# Patient Record
Sex: Male | Born: 1960 | Race: White | Hispanic: No | Marital: Married | Smoking: Never smoker
Health system: Southern US, Community
[De-identification: ages and names within clinical notes are randomized; demographics above are authoritative.]

## PROBLEM LIST (undated history)

## (undated) DIAGNOSIS — E7849 Other hyperlipidemia: Secondary | ICD-10-CM

## (undated) DIAGNOSIS — M47817 Spondylosis without myelopathy or radiculopathy, lumbosacral region: Secondary | ICD-10-CM

## (undated) DIAGNOSIS — M159 Polyosteoarthritis, unspecified: Secondary | ICD-10-CM

## (undated) DIAGNOSIS — F419 Anxiety disorder, unspecified: Secondary | ICD-10-CM

## (undated) DIAGNOSIS — N200 Calculus of kidney: Secondary | ICD-10-CM

## (undated) DIAGNOSIS — G2581 Restless legs syndrome: Secondary | ICD-10-CM

## (undated) DIAGNOSIS — T8859XA Other complications of anesthesia, initial encounter: Secondary | ICD-10-CM

## (undated) HISTORY — DX: Spondylosis without myelopathy or radiculopathy, lumbosacral region: M47.817

## (undated) HISTORY — PX: CARPAL TUNNEL RELEASE: SHX101

## (undated) HISTORY — PX: SHOULDER SURGERY: SHX246

## (undated) HISTORY — DX: Polyosteoarthritis, unspecified: M15.9

## (undated) HISTORY — DX: Anxiety disorder, unspecified: F41.9

## (undated) HISTORY — DX: Calculus of kidney: N20.0

## (undated) HISTORY — DX: Restless legs syndrome: G25.81

## (undated) HISTORY — DX: Other hyperlipidemia: E78.49

---

## 2002-01-03 ENCOUNTER — Emergency Department (HOSPITAL_COMMUNITY): Admission: EM | Admit: 2002-01-03 | Discharge: 2002-01-03 | Payer: Self-pay | Admitting: Emergency Medicine

## 2003-08-17 ENCOUNTER — Ambulatory Visit (HOSPITAL_COMMUNITY): Admission: RE | Admit: 2003-08-17 | Discharge: 2003-08-17 | Payer: Self-pay | Admitting: Internal Medicine

## 2003-08-17 ENCOUNTER — Encounter: Payer: Self-pay | Admitting: Internal Medicine

## 2009-05-08 ENCOUNTER — Emergency Department (HOSPITAL_COMMUNITY): Admission: EM | Admit: 2009-05-08 | Discharge: 2009-05-08 | Payer: Self-pay | Admitting: Emergency Medicine

## 2011-02-26 LAB — URINALYSIS, ROUTINE W REFLEX MICROSCOPIC
Glucose, UA: NEGATIVE mg/dL
Leukocytes, UA: NEGATIVE
Specific Gravity, Urine: >1.03 — ABNORMAL HIGH (ref 1.005–1.030)

## 2011-02-26 LAB — URINE MICROSCOPIC-ADD ON

## 2014-01-13 ENCOUNTER — Other Ambulatory Visit (HOSPITAL_COMMUNITY): Payer: Self-pay

## 2014-01-13 DIAGNOSIS — G4730 Sleep apnea, unspecified: Secondary | ICD-10-CM

## 2014-03-03 ENCOUNTER — Other Ambulatory Visit (HOSPITAL_COMMUNITY): Payer: Self-pay

## 2014-03-03 DIAGNOSIS — G4730 Sleep apnea, unspecified: Secondary | ICD-10-CM

## 2014-12-22 DIAGNOSIS — M75100 Unspecified rotator cuff tear or rupture of unspecified shoulder, not specified as traumatic: Secondary | ICD-10-CM | POA: Insufficient documentation

## 2014-12-22 DIAGNOSIS — M19012 Primary osteoarthritis, left shoulder: Secondary | ICD-10-CM | POA: Insufficient documentation

## 2014-12-22 DIAGNOSIS — M19019 Primary osteoarthritis, unspecified shoulder: Secondary | ICD-10-CM | POA: Insufficient documentation

## 2015-07-04 DIAGNOSIS — K635 Polyp of colon: Secondary | ICD-10-CM | POA: Insufficient documentation

## 2016-01-06 ENCOUNTER — Other Ambulatory Visit (HOSPITAL_COMMUNITY): Payer: Self-pay | Admitting: Internal Medicine

## 2016-01-06 DIAGNOSIS — R7989 Other specified abnormal findings of blood chemistry: Secondary | ICD-10-CM

## 2016-01-06 DIAGNOSIS — R945 Abnormal results of liver function studies: Principal | ICD-10-CM

## 2016-01-13 ENCOUNTER — Ambulatory Visit (HOSPITAL_COMMUNITY): Admission: RE | Admit: 2016-01-13 | Payer: BC Managed Care – PPO | Source: Ambulatory Visit

## 2018-05-14 ENCOUNTER — Other Ambulatory Visit: Payer: Self-pay | Admitting: Unknown Physician Specialty

## 2018-05-14 DIAGNOSIS — R430 Anosmia: Secondary | ICD-10-CM

## 2018-05-29 ENCOUNTER — Ambulatory Visit
Admission: RE | Admit: 2018-05-29 | Discharge: 2018-05-29 | Disposition: A | Discharge status: Home / Self Care | Payer: BLUE CROSS/BLUE SHIELD | Source: Ambulatory Visit | Attending: Unknown Physician Specialty | Admitting: Unknown Physician Specialty

## 2019-08-28 ENCOUNTER — Other Ambulatory Visit: Payer: Self-pay

## 2019-08-28 ENCOUNTER — Ambulatory Visit: Payer: Self-pay

## 2019-08-28 DIAGNOSIS — Z23 Encounter for immunization: Secondary | ICD-10-CM

## 2020-03-18 DIAGNOSIS — R69 Illness, unspecified: Secondary | ICD-10-CM | POA: Diagnosis not present

## 2020-03-18 DIAGNOSIS — Z79899 Other long term (current) drug therapy: Secondary | ICD-10-CM | POA: Diagnosis not present

## 2020-03-18 DIAGNOSIS — R7301 Impaired fasting glucose: Secondary | ICD-10-CM | POA: Diagnosis not present

## 2020-03-18 DIAGNOSIS — G2581 Restless legs syndrome: Secondary | ICD-10-CM | POA: Diagnosis not present

## 2020-03-18 DIAGNOSIS — Z125 Encounter for screening for malignant neoplasm of prostate: Secondary | ICD-10-CM | POA: Diagnosis not present

## 2020-03-18 DIAGNOSIS — E785 Hyperlipidemia, unspecified: Secondary | ICD-10-CM | POA: Diagnosis not present

## 2020-03-18 DIAGNOSIS — R945 Abnormal results of liver function studies: Secondary | ICD-10-CM | POA: Diagnosis not present

## 2020-03-25 DIAGNOSIS — R69 Illness, unspecified: Secondary | ICD-10-CM | POA: Diagnosis not present

## 2020-03-25 DIAGNOSIS — G2581 Restless legs syndrome: Secondary | ICD-10-CM | POA: Diagnosis not present

## 2020-03-25 DIAGNOSIS — I10 Essential (primary) hypertension: Secondary | ICD-10-CM | POA: Diagnosis not present

## 2020-03-25 DIAGNOSIS — Z0001 Encounter for general adult medical examination with abnormal findings: Secondary | ICD-10-CM | POA: Diagnosis not present

## 2020-03-25 DIAGNOSIS — R945 Abnormal results of liver function studies: Secondary | ICD-10-CM | POA: Diagnosis not present

## 2020-03-25 DIAGNOSIS — Z6838 Body mass index (BMI) 38.0-38.9, adult: Secondary | ICD-10-CM | POA: Diagnosis not present

## 2020-03-25 DIAGNOSIS — R7301 Impaired fasting glucose: Secondary | ICD-10-CM | POA: Diagnosis not present

## 2020-07-05 DIAGNOSIS — H43812 Vitreous degeneration, left eye: Secondary | ICD-10-CM | POA: Diagnosis not present

## 2020-07-19 DIAGNOSIS — D3131 Benign neoplasm of right choroid: Secondary | ICD-10-CM | POA: Diagnosis not present

## 2020-07-19 DIAGNOSIS — H4312 Vitreous hemorrhage, left eye: Secondary | ICD-10-CM | POA: Diagnosis not present

## 2020-07-19 DIAGNOSIS — H33312 Horseshoe tear of retina without detachment, left eye: Secondary | ICD-10-CM | POA: Diagnosis not present

## 2020-07-19 DIAGNOSIS — Q141 Congenital malformation of retina: Secondary | ICD-10-CM | POA: Diagnosis not present

## 2020-07-21 DIAGNOSIS — H33311 Horseshoe tear of retina without detachment, right eye: Secondary | ICD-10-CM | POA: Diagnosis not present

## 2020-07-21 DIAGNOSIS — H4312 Vitreous hemorrhage, left eye: Secondary | ICD-10-CM | POA: Diagnosis not present

## 2020-07-22 DIAGNOSIS — H4312 Vitreous hemorrhage, left eye: Secondary | ICD-10-CM | POA: Diagnosis not present

## 2020-07-22 DIAGNOSIS — H33312 Horseshoe tear of retina without detachment, left eye: Secondary | ICD-10-CM | POA: Diagnosis not present

## 2020-07-29 DIAGNOSIS — H33312 Horseshoe tear of retina without detachment, left eye: Secondary | ICD-10-CM | POA: Diagnosis not present

## 2020-08-17 DIAGNOSIS — H31092 Other chorioretinal scars, left eye: Secondary | ICD-10-CM | POA: Diagnosis not present

## 2020-10-19 DIAGNOSIS — I4819 Other persistent atrial fibrillation: Secondary | ICD-10-CM

## 2020-10-19 HISTORY — DX: Other persistent atrial fibrillation: I48.19

## 2020-11-08 ENCOUNTER — Ambulatory Visit (INDEPENDENT_AMBULATORY_CARE_PROVIDER_SITE_OTHER): Payer: 59

## 2020-11-08 ENCOUNTER — Ambulatory Visit
Admission: EM | Admit: 2020-11-08 | Discharge: 2020-11-08 | Disposition: A | Discharge status: Home / Self Care | Payer: 59 | Attending: Family Medicine | Admitting: Family Medicine

## 2020-11-08 ENCOUNTER — Encounter (HOSPITAL_COMMUNITY): Payer: Self-pay | Admitting: Emergency Medicine

## 2020-11-08 ENCOUNTER — Emergency Department (HOSPITAL_COMMUNITY)
Admission: EM | Admit: 2020-11-08 | Discharge: 2020-11-08 | Disposition: A | Discharge status: Home / Self Care | Payer: 59 | Attending: Emergency Medicine | Admitting: Emergency Medicine

## 2020-11-08 ENCOUNTER — Emergency Department (HOSPITAL_COMMUNITY): Payer: 59

## 2020-11-08 ENCOUNTER — Encounter: Payer: Self-pay | Admitting: Emergency Medicine

## 2020-11-08 ENCOUNTER — Other Ambulatory Visit: Payer: Self-pay

## 2020-11-08 DIAGNOSIS — R0602 Shortness of breath: Secondary | ICD-10-CM | POA: Diagnosis not present

## 2020-11-08 DIAGNOSIS — R000 Tachycardia, unspecified: Secondary | ICD-10-CM | POA: Diagnosis not present

## 2020-11-08 DIAGNOSIS — I4891 Unspecified atrial fibrillation: Secondary | ICD-10-CM | POA: Insufficient documentation

## 2020-11-08 DIAGNOSIS — R059 Cough, unspecified: Secondary | ICD-10-CM | POA: Diagnosis not present

## 2020-11-08 DIAGNOSIS — U071 COVID-19: Secondary | ICD-10-CM

## 2020-11-08 DIAGNOSIS — R0902 Hypoxemia: Secondary | ICD-10-CM | POA: Diagnosis not present

## 2020-11-08 DIAGNOSIS — R918 Other nonspecific abnormal finding of lung field: Secondary | ICD-10-CM | POA: Diagnosis not present

## 2020-11-08 HISTORY — DX: COVID-19: U07.1

## 2020-11-08 LAB — BASIC METABOLIC PANEL
Anion gap: 11 (ref 5–15)
BUN: 20 mg/dL (ref 6–20)
CO2: 24 mmol/L (ref 22–32)
Calcium: 8.9 mg/dL (ref 8.9–10.3)
Chloride: 104 mmol/L (ref 98–111)
Creatinine, Ser: 1.01 mg/dL (ref 0.61–1.24)
GFR, Estimated: >60 mL/min (ref >60)
Glucose, Bld: 154 mg/dL — ABNORMAL HIGH (ref 70–99)
Potassium: 3.3 mmol/L — ABNORMAL LOW (ref 3.5–5.1)
Sodium: 139 mmol/L (ref 135–145)

## 2020-11-08 LAB — CBC
HCT: 44.8 % (ref 39.0–52.0)
Hemoglobin: 15.3 g/dL (ref 13.0–17.0)
MCH: 29.8 pg (ref 26.0–34.0)
MCHC: 34.2 g/dL (ref 30.0–36.0)
MCV: 87.2 fL (ref 80.0–100.0)
Platelets: 174 10*3/uL (ref 150–400)
RBC: 5.14 MIL/uL (ref 4.22–5.81)
RDW: 12.3 % (ref 11.5–15.5)
WBC: 5.4 10*3/uL (ref 4.0–10.5)
nRBC: 0 % (ref 0.0–0.2)

## 2020-11-08 LAB — RESP PANEL BY RT-PCR (FLU A&B, COVID) ARPGX2
Influenza A by PCR: NEGATIVE
Influenza B by PCR: NEGATIVE
SARS Coronavirus 2 by RT PCR: POSITIVE — AB

## 2020-11-08 LAB — BRAIN NATRIURETIC PEPTIDE: B Natriuretic Peptide: 41 pg/mL (ref 0.0–100.0)

## 2020-11-08 LAB — TROPONIN I (HIGH SENSITIVITY): Troponin I (High Sensitivity): 5 ng/L (ref <18)

## 2020-11-08 MED ORDER — ALBUTEROL SULFATE HFA 108 (90 BASE) MCG/ACT IN AERS: 2.0000 | INHALATION_SPRAY | Freq: Once | RESPIRATORY_TRACT | Status: AC

## 2020-11-08 MED ORDER — APIXABAN 5 MG PO TABS: 5.0000 mg | ORAL_TABLET | Freq: Two times a day (BID) | ORAL | 0 refills | Status: DC

## 2020-11-08 MED ORDER — METOPROLOL TARTRATE 5 MG/5ML IV SOLN
5.0000 mg | Freq: Once | INTRAVENOUS | Status: AC
  Administered 2020-11-08: 14:00:00 5 mg via INTRAVENOUS
  Filled 2020-11-08: qty 5

## 2020-11-08 MED ORDER — METOPROLOL TARTRATE 25 MG PO TABS: 50.0000 mg | ORAL_TABLET | Freq: Two times a day (BID) | ORAL | 0 refills | Status: DC

## 2020-11-08 MED ORDER — ALBUTEROL SULFATE HFA 108 (90 BASE) MCG/ACT IN AERS
INHALATION_SPRAY | RESPIRATORY_TRACT | Status: AC
  Administered 2020-11-08: 14:00:00 2 via RESPIRATORY_TRACT
  Filled 2020-11-08: qty 6.7

## 2020-11-08 NOTE — ED Triage Notes (Signed)
Pt sent from urgent care due to tachycardia and SHOB. Pt reports SHOB and fever x 5 days.

## 2020-11-08 NOTE — ED Provider Notes (Signed)
[facility] EMERGENCY DEPARTMENT Provider Note  CSN: [id number] Arrival date & time: 11/08/20 1104    History Chief Complaint  Patient presents with  . Shortness of Breath    HPI  Michael Fuller is a 59 y.o. male with no significant PMH reports he had cough and congestion for a couple of weeks that he thought was getting better but since Friday of last week (4-5 days ago) he has had worsening SOB, DOE and chest tightness. Improved with rest, not associated with fever or leg swelling. He was not better this morning so he went to UC but was sent to the ED for HR 150s.    History reviewed. No pertinent past medical history.  Past Surgical History:  Procedure Laterality Date  . BACK SURGERY      No family history on file.  Social History   Tobacco Use  . Smoking status: Never Smoker  . Smokeless tobacco: Never Used  Substance Use Topics  . Alcohol use: Yes    Comment: occ  . Drug use: Never     Home Medications Prior to Admission medications   Medication Sig Start Date End Date Taking? Authorizing Provider  apixaban (ELIQUIS) 5 MG TABS tablet Take 1 tablet (5 mg total) by mouth 2 (two) times daily. 11/08/20 12/08/20  Truddie Hidden, MD  metoprolol tartrate (LOPRESSOR) 25 MG tablet Take 2 tablets (50 mg total) by mouth 2 (two) times daily. 11/08/20   Truddie Hidden, MD     Allergies    Patient has no known allergies.   Review of Systems   Review of Systems A comprehensive review of systems was completed and negative except as noted in HPI.    Physical Exam BP (!) 147/89   Pulse 84   Resp 20   Ht 6\' 1"  (1.854 m)   Wt 127 kg   SpO2 99%   BMI 36.94 kg/m   Physical Exam Vitals and nursing note reviewed.  Constitutional:      Appearance: Normal appearance.  HENT:     Head: Normocephalic and atraumatic.     Nose: Nose normal.     Mouth/Throat:     Mouth: Mucous membranes are moist.  Eyes:     Extraocular Movements: Extraocular movements  intact.     Conjunctiva/sclera: Conjunctivae normal.  Cardiovascular:     Rate and Rhythm: Tachycardia present. Rhythm irregular.  Pulmonary:     Effort: Pulmonary effort is normal.     Breath sounds: Normal breath sounds.  Abdominal:     General: Abdomen is flat.     Palpations: Abdomen is soft.     Tenderness: There is no abdominal tenderness.  Musculoskeletal:        General: No swelling. Normal range of motion.     Cervical back: Neck supple.  Skin:    General: Skin is warm and dry.  Neurological:     General: No focal deficit present.     Mental Status: He is alert.  Psychiatric:        Mood and Affect: Mood normal.      ED Results / Procedures / Treatments   Labs (all labs ordered are listed, but only abnormal results are displayed) Labs Reviewed  RESP PANEL BY RT-PCR (FLU A&B, COVID) ARPGX2 - Abnormal; Notable for the following components:      Result Value   SARS Coronavirus 2 by RT PCR POSITIVE (*)    All other components within normal limits  BASIC  METABOLIC PANEL - Abnormal; Notable for the following components:   Potassium 3.3 (*)    Glucose, Bld 154 (*)    All other components within normal limits  CBC  BRAIN NATRIURETIC PEPTIDE  TROPONIN I (HIGH SENSITIVITY)  TROPONIN I (HIGH SENSITIVITY)    EKG EKG Interpretation  Date/Time:  Tuesday November 08 2020 11:12:42 EST Ventricular Rate:  116 PR Interval:    QRS Duration: 84 QT Interval:  316 QTC Calculation: 439 R Axis:   58 Text Interpretation: Atrial fibrillation with rapid ventricular response No old tracing to compare Confirmed by Calvert Cantor [phone]) on 11/08/2020 11:30:57 AM   Radiology DG Chest 2 View  Result Date: 11/08/2020 CLINICAL DATA:  Shortness of breath for 5 days, low oxygen saturation, tachycardia, cough EXAM: CHEST - 2 VIEW COMPARISON:  None FINDINGS: Normal heart size, mediastinal contours, and pulmonary vascularity. Patchy BILATERAL pulmonary infiltrates consistent with  multifocal pneumonia, cannot exclude COVID-19 . No pleural effusion or pneumothorax. Osseous structures unremarkable. IMPRESSION: Patchy BILATERAL pulmonary infiltrates consistent with multifocal pneumonia, cannot exclude COVID-19 as etiology. Electronically Signed   By: Lavonia Dana M.D.   On: 11/08/2020 10:49    Procedures Procedures  Medications Ordered in the ED Medications  metoprolol tartrate (LOPRESSOR) injection 5 mg (has no administration in time range)     MDM Rules/Calculators/A&P MDM Patient arrives from UC with SOB and tachycardia. EKG shows afib, RVR, variable rate on monitor from 90-120s. BP is adequate. No distress while resting. Will check labs, CXR and Covid swab.  ED Course  I have reviewed the triage vital signs and the nursing notes.  Pertinent labs & imaging results that were available during my care of the patient were reviewed by me and considered in my medical decision making (see chart for details).  Clinical Course as of 11/08/20 1347  Tue Nov 08, 2020  1215 CBC, BMP, Trop neg.  [CS]  1238 BNP is normal.  [CS]  5465 Covid is positive. CXR from UC reviewed and shows bilateral infiltrates, he is not hypoxic but with new onset afib, will discuss with cardiology.  [CS]  6812 Spoke with Dr. Domenic Polite, Cardiology who does not feel the patient needs to be admitted for his Afib, recommends starting metoprolol and eliquis for rate control and stroke prevention. Outpatient follow up in Afib clinic when recovered from Covid. Patient also advised to obtain a home SpO2 monitor and to return to the ED if he becomes hypoxic. Referral to [facility] as well. Patient amenable to this plan. One dose of IV metoprolol prior to discharge.  [CS]    Clinical Course User Index [CS] Truddie Hidden, MD    Final Clinical Impression(s) / ED Diagnoses Final diagnoses:  Atrial fibrillation, unspecified type ([place])  COVID-19    Rx / DC Orders ED Discharge Orders         Ordered     metoprolol tartrate (LOPRESSOR) 25 MG tablet  2 times daily        11/08/20 1347    apixaban (ELIQUIS) 5 MG TABS tablet  2 times daily        11/08/20 1347           Truddie Hidden, MD 11/08/20 1347

## 2020-11-08 NOTE — ED Triage Notes (Signed)
Shortness of breath for past 4-5 days

## 2020-11-08 NOTE — ED Notes (Signed)
Date and time results received: 11/08/20 1154 (use smartphrase ".now" to insert current time)  Test: Covid Critical Value: Positive  Name of Provider Notified:Dr. Karle Starch  Orders Received? Or Actions Taken?:

## 2020-11-08 NOTE — ED Notes (Signed)
Patient is being discharged from the Urgent Care and sent to the Emergency Department via pov. Per Faustino Congress , patient is in need of higher level of care due to shortness of breath elevated HR  Patient is aware and verbalizes understanding of plan of care.  Vitals:   11/08/20 1028  BP: (!) 155/92  Pulse: 100  Resp: 19  Temp: 97.6 F (36.4 C)  SpO2: 97%

## 2020-11-10 ENCOUNTER — Telehealth (HOSPITAL_COMMUNITY): Payer: Self-pay | Admitting: Physician Assistant

## 2020-11-10 NOTE — Telephone Encounter (Signed)
Called and left message for patient to call back.  Need to schedule ED f/u appt 1st week of January d/t being Covid positive.

## 2020-11-22 DIAGNOSIS — I480 Paroxysmal atrial fibrillation: Secondary | ICD-10-CM | POA: Diagnosis not present

## 2020-11-23 ENCOUNTER — Other Ambulatory Visit (HOSPITAL_COMMUNITY): Payer: Self-pay | Admitting: Internal Medicine

## 2020-11-23 DIAGNOSIS — I4891 Unspecified atrial fibrillation: Secondary | ICD-10-CM

## 2020-11-25 ENCOUNTER — Ambulatory Visit (HOSPITAL_COMMUNITY)
Admission: RE | Admit: 2020-11-25 | Discharge: 2020-11-25 | Disposition: A | Discharge status: Home / Self Care | Payer: 59 | Source: Ambulatory Visit | Attending: Internal Medicine | Admitting: Internal Medicine

## 2020-11-25 ENCOUNTER — Other Ambulatory Visit: Payer: Self-pay

## 2020-11-25 DIAGNOSIS — I4891 Unspecified atrial fibrillation: Secondary | ICD-10-CM | POA: Diagnosis not present

## 2020-11-25 DIAGNOSIS — Z79899 Other long term (current) drug therapy: Secondary | ICD-10-CM | POA: Diagnosis not present

## 2020-11-25 HISTORY — PX: TRANSTHORACIC ECHOCARDIOGRAM: SHX275

## 2020-11-25 LAB — ECHOCARDIOGRAM COMPLETE
AR max vel: 2.38 cm2
AV Area VTI: 2.54 cm2
AV Area mean vel: 2.55 cm2
AV Mean grad: 3.6 mmHg
AV Peak grad: 7.1 mmHg
Ao pk vel: 1.33 m/s
Area-P 1/2: 4.65 cm2
S' Lateral: 3 cm

## 2020-11-25 NOTE — Progress Notes (Signed)
*  PRELIMINARY RESULTS* Echocardiogram 2D Echocardiogram has been performed.  Michael Fuller 11/25/2020, 2:42 PM

## 2020-11-29 DIAGNOSIS — I480 Paroxysmal atrial fibrillation: Secondary | ICD-10-CM | POA: Diagnosis not present

## 2020-12-27 ENCOUNTER — Encounter: Payer: Self-pay | Admitting: *Deleted

## 2020-12-27 ENCOUNTER — Encounter: Payer: Self-pay | Admitting: Cardiology

## 2020-12-27 ENCOUNTER — Ambulatory Visit (INDEPENDENT_AMBULATORY_CARE_PROVIDER_SITE_OTHER): Payer: 59 | Admitting: Cardiology

## 2020-12-27 ENCOUNTER — Telehealth: Payer: Self-pay | Admitting: *Deleted

## 2020-12-27 ENCOUNTER — Other Ambulatory Visit: Payer: Self-pay

## 2020-12-27 DIAGNOSIS — I4891 Unspecified atrial fibrillation: Secondary | ICD-10-CM | POA: Diagnosis not present

## 2020-12-27 DIAGNOSIS — I4819 Other persistent atrial fibrillation: Secondary | ICD-10-CM | POA: Insufficient documentation

## 2020-12-27 DIAGNOSIS — E7849 Other hyperlipidemia: Secondary | ICD-10-CM | POA: Insufficient documentation

## 2020-12-27 DIAGNOSIS — R0609 Other forms of dyspnea: Secondary | ICD-10-CM | POA: Insufficient documentation

## 2020-12-27 DIAGNOSIS — R0600 Dyspnea, unspecified: Secondary | ICD-10-CM | POA: Diagnosis not present

## 2020-12-27 DIAGNOSIS — I10 Essential (primary) hypertension: Secondary | ICD-10-CM | POA: Diagnosis not present

## 2020-12-27 DIAGNOSIS — R0683 Snoring: Secondary | ICD-10-CM

## 2020-12-27 MED ORDER — METOPROLOL TARTRATE 50 MG PO TABS: 50.0000 mg | ORAL_TABLET | Freq: Two times a day (BID) | ORAL | 3 refills | Status: DC

## 2020-12-27 MED ORDER — MULTAQ 400 MG PO TABS: 400.0000 mg | ORAL_TABLET | Freq: Two times a day (BID) | ORAL | 6 refills | Status: DC

## 2020-12-27 MED ORDER — RIVAROXABAN 20 MG PO TABS: 20.0000 mg | ORAL_TABLET | Freq: Every day | ORAL | 11 refills | Status: DC

## 2020-12-27 NOTE — Telephone Encounter (Signed)
Patient called to inform Dr Ellyn Hack . He received a message from Dr Ria Comment office. Eliquis is not on his formulary . Patient was changed to Xarelto 20 mg daily.  patient states he took last dose of Eliquis 5 mg  this morning .   RN  discussed with [facility] pharmacist   Per Raquel , start Xarelto 20 mg this afternoon .  Rn informed patient to take medication at 6 pm  Each day . Do not miss any days  Prior to cardioversion   patient verbalized understanding.

## 2020-12-27 NOTE — Progress Notes (Signed)
. Primary Care Provider: Asencion Noble, MD Cardiologist: Michael Hew, MD Electrophysiologist: None  Clinic Note: Chief Complaint  Patient presents with  . Atrial Fibrillation    New diagnosis in setting of COVID-19 infection  . New Patient (Initial Visit)   ===================================  ASSESSMENT/PLAN   Problem List Items Addressed This Visit    New onset atrial fibrillation (HCC) (Chronic)    He appears to be in persistent A. fib.  He is still in A. fib, but rate controlled.  Not overly symptomatic.  With relative new onset, likely CAP can potentially get a bout of A. fib may keep him out of it. Continue Eliquis until we able to make a plan about continued treatment options.  Plan:  Start Multaq 40 mg twice Fuller, and increase metoprolol to 50 mg twice Fuller  Left atrial moderately enlarged.  Pending A. fib clinic evaluation, would consider the possibility of EP consultation for possible ablation.  Reduce alcohol intake  Echocardiogram was checked, however sodium ischemic evaluation: -> Check Myoview stress test.  Schedule for DCCV (he has now been on DOAC for at least a month.)  Once he has been converted, we will check a 30-day monitor to assess A. fib burden to see if he goes back into it.  He will follow up with A. fib clinic following cardioversion.   Shared Decision Making/Informed Consent The risks [chest pain, shortness of breath, cardiac arrhythmias, dizziness, blood pressure fluctuations, myocardial infarction, stroke/transient ischemic attack, nausea, vomiting, allergic reaction, radiation exposure, metallic taste sensation and life-threatening complications (estimated to be 1 in 10,000)], benefits (risk stratification, diagnosing coronary artery disease, treatment guidance) and alternatives of a nuclear stress test were discussed in detail with Michael Fuller and he agrees to proceed.       Relevant Medications   dronedarone (MULTAQ) 400 MG tablet    metoprolol tartrate (LOPRESSOR) 50 MG tablet   Other Relevant Orders   EKG 66-AYTK   Basic metabolic panel   CBC   MYOCARDIAL PERFUSION IMAGING   Amb Referral to [facility]   Essential hypertension (Chronic)    Borderline hypotensive today.  Heart rate is 96.  Plan: Increase Lopressor to 50 mg twice Fuller.  Neck step would be to consider ARB.       Relevant Medications   dronedarone (MULTAQ) 400 MG tablet   metoprolol tartrate (LOPRESSOR) 50 MG tablet   Hyperlipidemia due to dietary fat intake (Chronic)    LDL is 182.  I suspect he is getting a statin.  We can discuss once we have the results of the stress test.      Relevant Medications   dronedarone (MULTAQ) 400 MG tablet   metoprolol tartrate (LOPRESSOR) 50 MG tablet   Morbid obesity (HCC) (Chronic)    The patient understands the need to lose weight with diet and exercise. We have discussed specific strategies for this. He put on 15 lbs in the last month.      Relevant Orders   Split night study   DOE (dyspnea on exertion) (Chronic)    I think exertional dyspnea very well be related to deconditioning, obesity but also recovering from COVID-19.  He does seem to be more short of breath when he is in A. Fib.  He didn't necessarily notice chest tightness when his heart rate going fast, but I think he was just more sick from the COVID-19. Plan: Check Lexiscan Myoview.-He indicates with his arthritis, he would not be able  to walk on a treadmill.      Relevant Orders   MYOCARDIAL PERFUSION IMAGING   Loud snoring    Epworth score is 14-15.  One risk factor is having new onset A. fib.  We'll order polysomnogram.      Relevant Orders   Split night study     ===================================  HPI:    Michael Fuller is a 60 y.o. male with a PMH below who  is being seen today for the evaluation of Michael Fuller at the request of Michael Noble, MD.  Recent  Hospitalizations:   [name] ER (11/08/2020) reported cough and congestion for couple weeks, was getting better, but then noted 4 to 5 days of worsening dyspnea shortness of breath and chest tightness.  Symptoms improved with rest.  Not associate with fever leg swelling.  He went to urgent care and was sent to ER for heart rates in the 150s (A. fib RVR).=> Was given 5 mg IV metoprolol  COVID POSITIVE=> Cardiology Consult-Dr. Domenic Polite recommended metoprolol and Eliquis and outpatient A. fib clinic follow-up, when recovered from [name].  Recommended home oxygen monitor and return to ER if hypoxic.  Michael Fuller was seen by Dr. Willey Blade on November 29, 2020 for hospital follow-up.  He was pretty much asymptomatic, but still in A. fib.  Was on Eliquis started on January 4.  Rate controlled on low-dose metoprolol.  Thyroid was 1.62.  Potassium stable.  Echo rate is relatively normal.  Still drinks about 3-6 beers per night.  Recovering from [name], just noticing fatigue.  He had been vaccinated, but not a tetanus booster.  (Recommended booster after at least 1 month.)  Reviewed  CV studies:    The following studies were reviewed today: (if available, images/films reviewed: From Epic Chart or Care Everywhere) . 2D Echo 11/25/2020: EF 65 to 70%.  Hyperdynamic LV.  Normal diastolic pressures.  Moderate LA dilation.  Otherwise normal study.  Valves, normal RV/RA pressure.  Interval History:   Michael Fuller presents here today for hospital follow-up, he really doesn't feel any irregular heartbeats or palpitations.  He says when he lies down at night he can feel his irregular pulses.  He has not had any further tachycardia.  He feels a little worn out but no significant fatigue or exertional dyspnea.  He initially had some PND orthopnea with some dyspnea but that has improved.  His rates have not gone >100 since then.  Even when he was going fast, he denied any chest pain or pressure.  Just had a  swelling and dyspnea.  He was discharged on Eliquis and metoprolol, but no diuretic.  CV Review of Symptoms (Summary) Cardiovascular ROS: no chest pain or dyspnea on exertion positive for - irregular heartbeat, palpitations and Symptoms of PND and orthopnea as well as edema resolved back in December. negative for - chest pain, edema, orthopnea, paroxysmal nocturnal dyspnea, shortness of breath or May be just a little fatigue.  No syncope or near syncope, TIAs or amaurosis fugax.  The patient does not have symptoms concerning for COVID-19 infection (fever, chills, cough, or new shortness of breath).   REVIEWED OF SYSTEMS   Review of Systems  Constitutional: Positive for malaise/fatigue (No further malaise now-he is recovering from his Covid.). Negative for weight loss (He says he actually gained about 15 lbs in the last 5 to 6 days.).  HENT: Negative for congestion and nosebleeds.   Respiratory: Positive for shortness of breath (  During the initial few days when he was sick with Covid).   Cardiovascular: Positive for palpitations (He did feel a little bit of fluttering sensation, not tachycardia back in December.). Negative for chest pain, leg swelling and PND.  Gastrointestinal: Negative for abdominal pain, blood in stool and melena.  Genitourinary: Negative for hematuria.  Musculoskeletal: Positive for back pain and joint pain (Diffuse). Negative for myalgias.  Neurological: Positive for dizziness (Early on, but not now.) and weakness. Negative for focal weakness and loss of consciousness.  Psychiatric/Behavioral: Negative for depression and memory loss. The patient is nervous/anxious and has insomnia.        See Epworth score     EPWORTH SCORE Office Visit from 12/27/2020 in [facility]   12/27/2020   2113   How likely are you to doze off or fall asleep in the following situations?   Sitting and reading? Moderate chance of dozing  Watching TV? High chance of dozing   Sitting, inactive in a public place (e.g. a theatre or a meeting)? Slight chance of dozing  As a passenger in a car for an hour without a break? Moderate chance of dozing  Lying down to rest in the afternoon when circumstances permit? High chance of dozing  Sitting and talking to someone? Would never doze  Sitting quietly after a lunch without alcohol? High chance of dozing  In a car, while stopped for a few minutes in traffic?  Would never doze  Epworth Total Score 15  Document the patient's comorbid illnesses and symptoms of sleep apnea   The patient has the following comorbid illnesses: Cardiac Arrhythmia  The patient has the following symptoms of sleep apnea: Daytime Sleepiness; Snoring; Morning Headaches; Un-refreshing Sleep; Witnessed Apnea    I have reviewed and (if needed) personally updated the patient's problem list, medications, allergies, past medical and surgical history, social and family history.   PAST MEDICAL HISTORY   Past Medical History:  Diagnosis Date  . Anxiety disorder   . COVID-19 virus infection 11/08/2020   Cough and congestion for several weeks with 4 to 5 days worsening.  Marland Kitchen DJD (degenerative joint disease), lumbosacral   . Hyperlipidemia due to dietary fat intake   . Nephrolithiasis   . Osteoarthritis involving multiple joints on both sides of body   . Persistent atrial fibrillation ([place]) 10/2020   Initial diagnosis with the setting of COVID-19 infection  . RLS (restless legs syndrome)     PAST SURGICAL HISTORY   Past Surgical History:  Procedure Laterality Date  . BACK SURGERY  1996   Lumbosacral spine--two surgeries  . CARPAL TUNNEL RELEASE Bilateral   . SHOULDER SURGERY Bilateral     Immunization History  Administered Date(s) Administered  . Influenza,inj,Quad PF,6+ Mos 08/28/2019    MEDICATIONS/ALLERGIES   Current Meds  Medication Sig  . dronedarone (MULTAQ) 400 MG tablet Take 1 tablet (400 mg total) by mouth 2 (two) times Fuller  with a meal.  . metoprolol tartrate (LOPRESSOR) 50 MG tablet Take 1 tablet (50 mg total) by mouth 2 (two) times Fuller.  Marland Kitchen venlafaxine (EFFEXOR) 75 MG tablet Take 75 mg by mouth 2 (two) times Fuller.  . [DISCONTINUED] apixaban (ELIQUIS) 5 MG TABS tablet Take 1 tablet (5 mg total) by mouth 2 (two) times Fuller.  . [DISCONTINUED] metoprolol tartrate (LOPRESSOR) 25 MG tablet Take 2 tablets (50 mg total) by mouth 2 (two) times Fuller.  Metoprolol and Eliquis started in December 2021  No Known Allergies  SOCIAL HISTORY/FAMILY  HISTORY   Reviewed in Epic:  Pertinent findings:  Social History   Tobacco Use  . Smoking status: Never Smoker  . Smokeless tobacco: Never Used  Substance Use Topics  . Alcohol use: Yes    Comment: occ  . Drug use: Never   Social History   Social History Narrative   Married father of two sons-28 and 58.  He lives with his wife in [place].   He drinks maybe 4-5 beers a day for "pain medication"    His job as a working in shift, often working night shift.   Does not regularly exercise per se. ->  His work involves doing jobs around farm.  Unfortunately hasn't been all that active recently since his COVID-19 infection, and has been scared to be very aggressive with being on Eliquis.    OBJCTIVE -PE, EKG, labs   Wt Readings from Last 3 Encounters:  12/27/20 (!) 311 lb (141.1 kg)  11/08/20 279 lb 15.8 oz (127 kg)  11/08/20 280 lb (127 kg)    Physical Exam: BP 138/90   Pulse 96   Ht 6\' 2"  (1.88 m)   Wt (!) 311 lb (141.1 kg)   BMI 39.93 kg/m  Physical Exam Constitutional:      General: He is not in acute distress.    Appearance: Normal appearance. He is obese. He is not ill-appearing or toxic-appearing.     Comments: With risk factors, BMI of 39 =morbid obesity.  HENT:     Head: Normocephalic and atraumatic.  Neck:     Vascular: No carotid bruit, hepatojugular reflux or JVD.  Cardiovascular:     Rate and Rhythm: Normal rate. Rhythm irregularly  irregular.     Pulses: Normal pulses.     Heart sounds: S2 normal. Heart sounds are distant. No murmur heard. No friction rub. No gallop.   Pulmonary:     Effort: Pulmonary effort is normal. No respiratory distress.     Breath sounds: Normal breath sounds.  Chest:     Chest wall: No tenderness.  Abdominal:     General: Bowel sounds are normal. There is no distension.     Palpations: Abdomen is soft. There is no mass.     Comments: No HSM or bruit; truncal obesity  Musculoskeletal:        General: No swelling. Normal range of motion.     Cervical back: Normal range of motion and neck supple.  Neurological:     General: No focal deficit present.     Mental Status: He is alert and oriented to person, place, and time.     Cranial Nerves: No cranial nerve deficit.     Motor: No weakness.     Gait: Gait normal.  Psychiatric:        Mood and Affect: Mood normal.        Behavior: Behavior normal.        Thought Content: Thought content normal.        Judgment: Judgment normal.     Adult ECG Report  Rate: 96 ;  Rhythm: atrial fibrillation;   Narrative Interpretation: Otherwise relatively normal.  Recent Labs: March 18, 2020: TC 11/20/2016, TG 89, HDL 44, LDL 182. No results found for: CHOL, HDL, LDLCALC, LDLDIRECT, TRIG, CHOLHDL Lab Results  Component Value Date   CREATININE 1.01 11/08/2020   BUN 20 11/08/2020   NA 139 11/08/2020   K 3.3 (L) 11/08/2020   CL 104 11/08/2020   CO2 24 11/08/2020  CBC Latest Ref Rng & Units 11/08/2020  WBC 4.0 - 10.5 K/uL 5.4  Hemoglobin 13.0 - 17.0 g/dL 15.3  Hematocrit 39.0 - 52.0 % 44.8  Platelets 150 - 400 K/uL 174    No results found for: TSH  ==================================================  COVID-19 Education: The signs and symptoms of COVID-19 were discussed with the patient and how to seek care for testing (follow up with PCP or arrange E-visit).   The importance of social distancing and COVID-19 vaccination was discussed  today. The patient is practicing social distancing & Masking.   I spent a total of 52 minutes with the patient spent in direct patient consultation.  -> Detailed description of atrial fibrillation and the pathophysiology as well as treatment options. Additional time spent with chart review  / charting (studies, outside notes, etc): 25 min; extensive chart review with medical history and surgical history updated. Total Time: 77 min   Current medicines are reviewed at length with the patient today.  (+/- concerns) N/A  This visit occurred during the SARS-CoV-2 public health emergency.  Safety protocols were in place, including screening questions prior to the visit, additional usage of staff PPE, and extensive cleaning of exam room while observing appropriate contact time as indicated for disinfecting solutions.  Notice: This dictation was prepared with Dragon dictation along with smaller phrase technology. Any transcriptional errors that result from this process are unintentional and may not be corrected upon review.  Patient Instructions / Medication Changes & Studies & Tests Ordered   Patient Instructions  Medication Instructions:   start  Taking  Multaq 400 mg one tablet twice a day   Increase Metoprolol tartrate to 50 mg one tablet twice a day   *If you need a refill on your cardiac medications before your next appointment, please call your pharmacy*   Lab Work: Cbc  bmp You will need a COVID-19  test prior to your procedure. You are scheduled for  Feb 21 ,2022  at PM. This is a Drive Up Visit at [address] [place], [place] [address]. Someone will direct you to the appropriate testing line. Stay in your car and someone will be with you shortly.   If you have labs (blood work) drawn today and your tests are completely normal, you will receive your results only by: Marland Kitchen MyChart Message (if you have MyChart) OR . A paper copy in the mail If you have any lab test that is abnormal  or we need to change your treatment, we will call you to review the results.   Testing/Procedures: WILL BE SCHEDULE ON Jan 12, 2021 3 DAYS PRIOR TO CARDIOVERSION  - NEED COVID -TEST Your physician has recommended that you have a Cardioversion (DCCV). Electrical Cardioversion uses a jolt of electricity to your heart either through paddles or wired patches attached to your chest. This is a controlled, usually prescheduled, procedure. Defibrillation is done under light anesthesia in the hospital, and you usually go home the day of the procedure. This is done to get your heart back into a normal rhythm. You are not awake for the procedure. Please see the instruction sheet given to you today.    MONITOR WILL BE MAILED TO YOU Your physician has recommended that you wear an  30 DAY event monitor  WILL BE PLACED AFTER CARDIOVERSION . Event monitors are medical devices that record the heart's electrical activity. Doctors most often [place] these monitors to diagnose arrhythmias. Arrhythmias are problems with the speed or rhythm of the  heartbeat. The monitor is a small, portable device. You can wear one while you do your normal Fuller activities. This is usually used to diagnose what is causing palpitations/syncope (passing out).   WILL BE SCHEDULE AFTER INSURANCE AUTHORIZATION Your physician has recommended that you have a sleep study. This test records several body functions during sleep, including: brain activity, eye movement, oxygen and carbon dioxide blood levels, heart rate and rhythm, breathing rate and rhythm, the flow of air through your mouth and nose, snoring, body muscle movements, and chest and belly movement.   [facility] has requested that you have a lexiscan myoview. Please follow instruction sheet, as given.     Follow-Up: At [facility], you and your health needs are our priority.  As part of our continuing mission to provide you with exceptional heart  care, we have created designated Provider Care Teams.  These Care Teams include your primary Cardiologist (physician) and Advanced Practice Providers (APPs -  Physician Assistants and Nurse Practitioners) who all work together to provide you with the care you need, when you need it.  We recommend signing up for the patient portal called "MyChart".  Sign up information is provided on this After Visit Summary.  MyChart is used to connect with patients for Virtual Visits (Telemedicine).  Patients are able to view lab/test results, encounter notes, upcoming appointments, etc.  Non-urgent messages can be sent to your provider as well.   To learn more about what you can do with MyChart, go to [email or web address].    Your next appointment:   3 month(s)  The format for your next appointment:   In Person  Provider:   Glenetta Hew, MD   Other Instructions Your physician recommends that you schedule a follow-up appointment in:  2 MONTHS WITH AFIB CLINIC - [place].   Cut down on alcohol consumption   [employer] Cardiac Event Monitor Instructions Your physician has requested you wear your cardiac event monitor for _30____ days, (1-30). [employer] may call or text to confirm a shipping address. The monitor will be sent to a land address via UPS. [employer] will not ship a monitor to a PO BOX. It typically takes 3-5 days to receive your monitor after it has been enrolled. [employer] will assist with USPS tracking if your package is delayed. The telephone number for [employer] is [phone]. Once you have received your monitor, please review the enclosed instructions. Instruction tutorials can also be viewed under help and settings on the enclosed cell phone. Your monitor has already been registered assigning a specific monitor serial # to you.  Applying the monitor Remove cell phone from case and turn it on. The  cell phone works as Dealer and needs to be within [employer] of you at all times. The cell phone will need to be charged on a Fuller basis. We recommend you plug the cell phone into the enclosed charger at your bedside table every night.  Monitor batteries: You will receive two monitor batteries labelled #1 and #2. These are your recorders. Plug battery #2 onto the second connection on the enclosed charger. Keep one battery on the charger at all times. This will keep the monitor battery deactivated. It will also keep it fully charged for when you need to switch your monitor batteries. A small light will be blinking on the battery emblem when it is charging. The  light on the battery emblem will remain on when the battery is fully charged.  Open package of a Monitor strip. Insert battery #1 into black hood on strip and gently squeeze monitor battery onto connection as indicated in instruction booklet. Set aside while preparing skin.  Choose location for your strip, vertical or horizontal, as indicated in the instruction booklet. Shave to remove all hair from location. There cannot be any lotions, oils, powders, or colognes on skin where monitor is to be applied. Wipe skin clean with enclosed Saline wipe. Dry skin completely.  Peel paper labeled #1 off the back of the Monitor strip exposing the adhesive. Place the monitor on the chest in the vertical or horizontal position shown in the instruction booklet. One arrow on the monitor strip must be pointing upward. Carefully remove paper labeled #2, attaching remainder of strip to your skin. Try not to create any folds or wrinkles in the strip as you apply it.  Firmly press and release the circle in the center of the monitor battery. You will hear a small beep. This is turning the monitor battery on. The heart emblem on the monitor battery will light up every 5 seconds if the monitor battery in turned on and connected to the patient securely.  Do not push and hold the circle down as this turns the monitor battery off. The cell phone will locate the monitor battery. A screen will appear on the cell phone checking the connection of your monitor strip. This may read poor connection initially but change to good connection within the next minute. Once your monitor accepts the connection you will hear a series of 3 beeps followed by a climbing crescendo of beeps. A screen will appear on the cell phone showing the two monitor strip placement options. Touch the picture that demonstrates where you applied the monitor strip.  Your monitor strip and battery are waterproof. You are able to shower, bathe, or swim with the monitor on. They just ask you do not submerge deeper than 3 feet underwater. We recommend removing the monitor if you are swimming in a lake, river, or ocean.  Your monitor battery will need to be switched to a fully charged monitor battery approximately once a week. The cell phone will alert you of an action which needs to be made.  On the cell phone, tap for details to reveal connection status, monitor battery status, and cell phone battery status. The green dots indicates your monitor is in good status. A red dot indicates there is something that needs your attention.  To record a symptom, click the circle on the monitor battery. In 30-60 seconds a list of symptoms will appear on the cell phone. Select your symptom and tap save. Your monitor will record a sustained or significant arrhythmia regardless of you clicking the button. Some patients do not feel the heart rhythm irregularities. [employer] will notify us of any serious or critical events.  Refer to instruction booklet for instructions on switching batteries, changing strips, the Do not disturb or Pause features, or any additional questions.  Call [employer] at [phone], to confirm your monitor is transmitting and record your baseline. They will answer any  questions you may have regarding the monitor instructions at that time.  Returning the monitor to [place] all equipment back into blue box. Peel off strip of paper to expose adhesive and close box securely. There is a prepaid UPS shipping label on this box. Drop in a UPS drop box, or  at a [place]. You may also contact [employer] to arrange UPS to pick up monitor package at your home.      Dear  Clarisa Schools are scheduled for a Cardioversion on Feb 24,2022 with Dr. Meda Coffee .  Please arrive at the [facility] (Main Entrance A) at [facility]: [address] [place], [place] [address] at  9 am.  DIET: Nothing to eat or drink after midnight except a sip of water with medications (see medication instructions below)  Medication Instructions:  Continue your anticoagulant: Eliquis  You will need to continue your anticoagulant after your procedure until you are told by your  Provider that it is safe to stop   Labs:  For patients receiving anesthesia for TEE and all Cardioversion patients: BMET, CBC within 1 week can be done in [place] at [facility]   and You will need a COVID-19  test prior to your procedure. You are scheduled for Jan 09, 2021 at  1:40  PM. This is a Drive Up Visit at [address] [place], [place] [address]. Someone will direct you to the appropriate testing line. Stay in your car and someone will be with you shortly.    You must have a responsible person to drive you home and stay in the waiting area during your procedure. Failure to do so could result in cancellation.  Bring your insurance cards.  *Special Note: Every effort is made to have your procedure done on time. Occasionally there are emergencies that occur at the hospital that may cause delays. Please be patient if a delay does occur.         Studies Ordered:   Orders Placed This Encounter  Procedures  . Basic metabolic panel  . CBC  . Amb Referral to AFIB Clinic   . MYOCARDIAL PERFUSION IMAGING  . CARDIAC EVENT MONITOR  . EKG 12-Lead  . Split night study     Michael Fuller, M.D., M.S. Interventional Cardiologist   Pager # [phone] Phone # [phone] [address] [place],  [address]   Thank you for choosing Heartcare at [facility]!!

## 2020-12-27 NOTE — Assessment & Plan Note (Addendum)
He appears to be in persistent A. fib.  He is still in A. fib, but rate controlled.  Not overly symptomatic.  With relative new onset, likely CAP can potentially get a bout of A. fib may keep him out of it. Continue Eliquis until we able to make a plan about continued treatment options.  Plan:  Start Multaq 40 mg twice daily, and increase metoprolol to 50 mg twice daily  Left atrial moderately enlarged.  Pending A. fib clinic evaluation, would consider the possibility of EP consultation for possible ablation.  Reduce alcohol intake  Echocardiogram was checked, however sodium ischemic evaluation: -> Check Myoview stress test.  Schedule for DCCV (he has now been on DOAC for at least a month.)  Once he has been converted, we will check a 30-day monitor to assess A. fib burden to see if he goes back into it.  He will follow up with A. fib clinic following cardioversion.   Shared Decision Making/Informed Consent The risks [chest pain, shortness of breath, cardiac arrhythmias, dizziness, blood pressure fluctuations, myocardial infarction, stroke/transient ischemic attack, nausea, vomiting, allergic reaction, radiation exposure, metallic taste sensation and life-threatening complications (estimated to be 1 in 10,000)], benefits (risk stratification, diagnosing coronary artery disease, treatment guidance) and alternatives of a nuclear stress test were discussed in detail with Mr. Genest and he agrees to proceed.

## 2020-12-27 NOTE — Assessment & Plan Note (Addendum)
I think exertional dyspnea very well be related to deconditioning, obesity but also recovering from COVID-19.  He does seem to be more short of breath when he is in A. Fib.  He didn't necessarily notice chest tightness when his heart rate going fast, but I think he was just more sick from the COVID-19. Plan: Check Lexiscan Myoview.-He indicates with his arthritis, he would not be able to walk on a treadmill.

## 2020-12-27 NOTE — Patient Instructions (Signed)
Medication Instructions:   start  Taking  Multaq 400 mg one tablet twice a day   Increase Metoprolol tartrate to 50 mg one tablet twice a day   *If you need a refill on your cardiac medications before your next appointment, please call your pharmacy*   Lab Work: Cbc  bmp You will need a COVID-19  test prior to your procedure. You are scheduled for  Feb 21 ,2022  at PM. This is a Drive Up Visit at [address] [place], [place] [address]. Someone will direct you to the appropriate testing line. Stay in your car and someone will be with you shortly.   If you have labs (blood work) drawn today and your tests are completely normal, you will receive your results only by: Marland Kitchen MyChart Message (if you have MyChart) OR . A paper copy in the mail If you have any lab test that is abnormal or we need to change your treatment, we will call you to review the results.   Testing/Procedures: WILL BE SCHEDULE ON Jan 12, 2021 3 DAYS PRIOR TO CARDIOVERSION  - NEED COVID -TEST Your physician has recommended that you have a Cardioversion (DCCV). Electrical Cardioversion uses a jolt of electricity to your heart either through paddles or wired patches attached to your chest. This is a controlled, usually prescheduled, procedure. Defibrillation is done under light anesthesia in the hospital, and you usually go home the day of the procedure. This is done to get your heart back into a normal rhythm. You are not awake for the procedure. Please see the instruction sheet given to you today.    MONITOR WILL BE MAILED TO YOU Your physician has recommended that you wear an  30 DAY event monitor  WILL BE PLACED AFTER CARDIOVERSION . Event monitors are medical devices that record the heart's electrical activity. Doctors most often [place] these monitors to diagnose arrhythmias. Arrhythmias are problems with the speed or rhythm of the heartbeat. The monitor is a small, portable device. You can wear one while you do your normal  daily activities. This is usually used to diagnose what is causing palpitations/syncope (passing out).   WILL BE SCHEDULE AFTER INSURANCE AUTHORIZATION Your physician has recommended that you have a sleep study. This test records several body functions during sleep, including: brain activity, eye movement, oxygen and carbon dioxide blood levels, heart rate and rhythm, breathing rate and rhythm, the flow of air through your mouth and nose, snoring, body muscle movements, and chest and belly movement.   [facility] has requested that you have a lexiscan myoview. Please follow instruction sheet, as given.     Follow-Up: At [facility], you and your health needs are our priority.  As part of our continuing mission to provide you with exceptional heart care, we have created designated Provider Care Teams.  These Care Teams include your primary Cardiologist (physician) and Advanced Practice Providers (APPs -  Physician Assistants and Nurse Practitioners) who all work together to provide you with the care you need, when you need it.  We recommend signing up for the patient portal called "MyChart".  Sign up information is provided on this After Visit Summary.  MyChart is used to connect with patients for Virtual Visits (Telemedicine).  Patients are able to view lab/test results, encounter notes, upcoming appointments, etc.  Non-urgent messages can be sent to your provider as well.   To learn more about what you can do with MyChart, go to  [email or web address].    Your next appointment:   3 month(s)  The format for your next appointment:   In Person  Provider:   Glenetta Hew, MD   Other Instructions Your physician recommends that you schedule a follow-up appointment in:  2 MONTHS WITH AFIB CLINIC - [place].   Cut down on alcohol consumption   [employer] Cardiac Event Monitor  Instructions Your physician has requested you wear your cardiac event monitor for _30____ days, (1-30). [employer] may call or text to confirm a shipping address. The monitor will be sent to a land address via UPS. [employer] will not ship a monitor to a PO BOX. It typically takes 3-5 days to receive your monitor after it has been enrolled. [employer] will assist with USPS tracking if your package is delayed. The telephone number for [employer] is [phone]. Once you have received your monitor, please review the enclosed instructions. Instruction tutorials can also be viewed under help and settings on the enclosed cell phone. Your monitor has already been registered assigning a specific monitor serial # to you.  Applying the monitor Remove cell phone from case and turn it on. The cell phone works as Dealer and needs to be within [employer] of you at all times. The cell phone will need to be charged on a daily basis. We recommend you plug the cell phone into the enclosed charger at your bedside table every night.  Monitor batteries: You will receive two monitor batteries labelled #1 and #2. These are your recorders. Plug battery #2 onto the second connection on the enclosed charger. Keep one battery on the charger at all times. This will keep the monitor battery deactivated. It will also keep it fully charged for when you need to switch your monitor batteries. A small light will be blinking on the battery emblem when it is charging. The light on the battery emblem will remain on when the battery is fully charged.  Open package of a Monitor strip. Insert battery #1 into black hood on strip and gently squeeze monitor battery onto connection as indicated in instruction booklet. Set aside while preparing skin.  Choose location for your strip, vertical or horizontal, as indicated in the instruction booklet. Shave to remove all hair from location. There cannot be any lotions, oils,  powders, or colognes on skin where monitor is to be applied. Wipe skin clean with enclosed Saline wipe. Dry skin completely.  Peel paper labeled #1 off the back of the Monitor strip exposing the adhesive. Place the monitor on the chest in the vertical or horizontal position shown in the instruction booklet. One arrow on the monitor strip must be pointing upward. Carefully remove paper labeled #2, attaching remainder of strip to your skin. Try not to create any folds or wrinkles in the strip as you apply it.  Firmly press and release the circle in the center of the monitor battery. You will hear a small beep. This is turning the monitor battery on. The heart emblem on the monitor battery will light up every 5 seconds if the monitor battery in turned on and connected to the patient securely. Do not push and hold the circle down as this turns the monitor battery off. The cell phone will locate the monitor battery. A screen will appear on the cell phone checking the connection of your monitor strip. This may read poor connection initially but change  to good connection within the next minute. Once your monitor accepts the connection you will hear a series of 3 beeps followed by a climbing crescendo of beeps. A screen will appear on the cell phone showing the two monitor strip placement options. Touch the picture that demonstrates where you applied the monitor strip.  Your monitor strip and battery are waterproof. You are able to shower, bathe, or swim with the monitor on. They just ask you do not submerge deeper than 3 feet underwater. We recommend removing the monitor if you are swimming in a lake, river, or ocean.  Your monitor battery will need to be switched to a fully charged monitor battery approximately once a week. The cell phone will alert you of an action which needs to be made.  On the cell phone, tap for details to reveal connection status, monitor battery status, and cell phone  battery status. The green dots indicates your monitor is in good status. A red dot indicates there is something that needs your attention.  To record a symptom, click the circle on the monitor battery. In 30-60 seconds a list of symptoms will appear on the cell phone. Select your symptom and tap save. Your monitor will record a sustained or significant arrhythmia regardless of you clicking the button. Some patients do not feel the heart rhythm irregularities. [employer] will notify us of any serious or critical events.  Refer to instruction booklet for instructions on switching batteries, changing strips, the Do not disturb or Pause features, or any additional questions.  Call [employer] at [phone], to confirm your monitor is transmitting and record your baseline. They will answer any questions you may have regarding the monitor instructions at that time.  Returning the monitor to [place] all equipment back into blue box. Peel off strip of paper to expose adhesive and close box securely. There is a prepaid UPS shipping label on this box. Drop in a UPS drop box, or at a UPS facility like [employer]. You may also contact [employer] to arrange UPS to pick up monitor package at your home.      Dear  Clarisa Schools are scheduled for a Cardioversion on Feb 24,2022 with Dr. Meda Coffee .  Please arrive at the [facility] (Main Entrance A) at [facility]: [address] [place], [place] [address] at  9 am.  DIET: Nothing to eat or drink after midnight except a sip of water with medications (see medication instructions below)  Medication Instructions:  Continue your anticoagulant: Eliquis  You will need to continue your anticoagulant after your procedure until you are told by your  Provider that it is safe to stop   Labs:  For patients receiving anesthesia for TEE and all Cardioversion patients: BMET, CBC within 1 week can be done in [place] at [facility]   and You will  need a COVID-19  test prior to your procedure. You are scheduled for Jan 09, 2021 at  1:40  PM. This is a Drive Up Visit at [address] [place], [place] [address]. Someone will direct you to the appropriate testing line. Stay in your car and someone will be with you shortly.    You must have a responsible person to drive you home and stay in the waiting area during your procedure. Failure to do so could result in cancellation.  Bring your insurance cards.  *Special Note: Every effort is made to have your procedure done on time. Occasionally there are emergencies that occur  at the hospital that may cause delays. Please be patient if a delay does occur.

## 2020-12-27 NOTE — Progress Notes (Signed)
Patient ID: Michael Fuller, male   DOB: January 14, 1961, 59 y.o.   MRN: [id number] Patient enrolled for [employer] to ship a 30 day cardiac event monitor to his home for 3/71/6967 application.

## 2020-12-27 NOTE — Assessment & Plan Note (Signed)
The patient understands the need to lose weight with diet and exercise. We have discussed specific strategies for this. He put on 15 lbs in the last month.

## 2020-12-27 NOTE — Assessment & Plan Note (Signed)
Borderline hypotensive today.  Heart rate is 96.  Plan: Increase Lopressor to 50 mg twice daily.  Neck step would be to consider ARB.

## 2020-12-27 NOTE — H&P (View-Only) (Signed)
. Primary Care Provider: Asencion Noble, MD Cardiologist: Glenetta Hew, MD Electrophysiologist: None  Clinic Note: Chief Complaint  Patient presents with  . Atrial Fibrillation    New diagnosis in setting of COVID-19 infection  . New Patient (Initial Visit)   ===================================  ASSESSMENT/PLAN   Problem List Items Addressed This Visit    New onset atrial fibrillation (HCC) (Chronic)    He appears to be in persistent A. fib.  He is still in A. fib, but rate controlled.  Not overly symptomatic.  With relative new onset, likely CAP can potentially get a bout of A. fib may keep him out of it. Continue Eliquis until we able to make a plan about continued treatment options.  Plan:  Start Multaq 40 mg twice daily, and increase metoprolol to 50 mg twice daily  Left atrial moderately enlarged.  Pending A. fib clinic evaluation, would consider the possibility of EP consultation for possible ablation.  Reduce alcohol intake  Echocardiogram was checked, however sodium ischemic evaluation: -> Check Myoview stress test.  Schedule for DCCV (he has now been on DOAC for at least a month.)  Once he has been converted, we will check a 30-day monitor to assess A. fib burden to see if he goes back into it.  He will follow up with A. fib clinic following cardioversion.   Shared Decision Making/Informed Consent The risks [chest pain, shortness of breath, cardiac arrhythmias, dizziness, blood pressure fluctuations, myocardial infarction, stroke/transient ischemic attack, nausea, vomiting, allergic reaction, radiation exposure, metallic taste sensation and life-threatening complications (estimated to be 1 in 10,000)], benefits (risk stratification, diagnosing coronary artery disease, treatment guidance) and alternatives of a nuclear stress test were discussed in detail with Mr. Monaco and he agrees to proceed.       Relevant Medications   dronedarone (MULTAQ) 400 MG tablet    metoprolol tartrate (LOPRESSOR) 50 MG tablet   Other Relevant Orders   EKG 16-SAYT   Basic metabolic panel   CBC   MYOCARDIAL PERFUSION IMAGING   Amb Referral to [place]   Essential hypertension (Chronic)    Borderline hypotensive today.  Heart rate is 96.  Plan: Increase Lopressor to 50 mg twice daily.  Neck step would be to consider ARB.       Relevant Medications   dronedarone (MULTAQ) 400 MG tablet   metoprolol tartrate (LOPRESSOR) 50 MG tablet   Hyperlipidemia due to dietary fat intake (Chronic)    LDL is 182.  I suspect he is getting a statin.  We can discuss once we have the results of the stress test.      Relevant Medications   dronedarone (MULTAQ) 400 MG tablet   metoprolol tartrate (LOPRESSOR) 50 MG tablet   Morbid obesity (HCC) (Chronic)    The patient understands the need to lose weight with diet and exercise. We have discussed specific strategies for this. He put on 15 lbs in the last month.      Relevant Orders   Split night study   DOE (dyspnea on exertion) (Chronic)    I think exertional dyspnea very well be related to deconditioning, obesity but also recovering from COVID-19.  He does seem to be more short of breath when he is in A. Fib.  He didn't necessarily notice chest tightness when his heart rate going fast, but I think he was just more sick from the COVID-19. Plan: Check Lexiscan Myoview.-He indicates with his arthritis, he would not be able  to walk on a treadmill.      Relevant Orders   MYOCARDIAL PERFUSION IMAGING   Loud snoring    Epworth score is 14-15.  One risk factor is having new onset A. fib.  We'll order polysomnogram.      Relevant Orders   Split night study     ===================================  HPI:    Michael Fuller is a 60 y.o. male with a PMH below who  is being seen today for the evaluation of [place] at the request of Asencion Noble, MD.  Recent  Hospitalizations:   [name], ER (11/08/2020) reported cough and congestion for couple weeks, was getting better, but then noted 4 to 5 days of worsening dyspnea shortness of breath and chest tightness.  Symptoms improved with rest.  Not associate with fever leg swelling.  He went to urgent care and was sent to ER for heart rates in the 150s (A. fib RVR).=> Was given 5 mg IV metoprolol  COVID POSITIVE=> Cardiology Consult-Dr. Domenic Polite recommended metoprolol and Eliquis and outpatient A. fib clinic follow-up, when recovered from [place].  Recommended home oxygen monitor and return to ER if hypoxic.  [name] was seen by Dr. Willey Blade on November 29, 2020 for hospital follow-up.  He was pretty much asymptomatic, but still in A. fib.  Was on Eliquis started on January 4.  Rate controlled on low-dose metoprolol.  Thyroid was 1.62.  Potassium stable.  Echo rate is relatively normal.  Still drinks about 3-6 beers per night.  Recovering from [place], just noticing fatigue.  He had been vaccinated, but not a tetanus booster.  (Recommended booster after at least 1 month.)  Reviewed  CV studies:    The following studies were reviewed today: (if available, images/films reviewed: From Epic Chart or Care Everywhere) . 2D Echo 11/25/2020: EF 65 to 70%.  Hyperdynamic LV.  Normal diastolic pressures.  Moderate LA dilation.  Otherwise normal study.  Valves, normal RV/RA pressure.  Interval History:   SHUNSUKE GRANZOW presents here today for hospital follow-up, he really doesn't feel any irregular heartbeats or palpitations.  He says when he lies down at night he can feel his irregular pulses.  He has not had any further tachycardia.  He feels a little worn out but no significant fatigue or exertional dyspnea.  He initially had some PND orthopnea with some dyspnea but that has improved.  His rates have not gone >100 since then.  Even when he was going fast, he denied any chest pain or pressure.  Just had a  swelling and dyspnea.  He was discharged on Eliquis and metoprolol, but no diuretic.  CV Review of Symptoms (Summary) Cardiovascular ROS: no chest pain or dyspnea on exertion positive for - irregular heartbeat, palpitations and Symptoms of PND and orthopnea as well as edema resolved back in December. negative for - chest pain, edema, orthopnea, paroxysmal nocturnal dyspnea, shortness of breath or May be just a little fatigue.  No syncope or near syncope, TIAs or amaurosis fugax.  The patient does not have symptoms concerning for COVID-19 infection (fever, chills, cough, or new shortness of breath).   REVIEWED OF SYSTEMS   Review of Systems  Constitutional: Positive for malaise/fatigue (No further malaise now-he is recovering from his Covid.). Negative for weight loss (He says he actually gained about 15 lbs in the last 5 to 6 days.).  HENT: Negative for congestion and nosebleeds.   Respiratory: Positive for shortness of breath (  During the initial few days when he was sick with Covid).   Cardiovascular: Positive for palpitations (He did feel a little bit of fluttering sensation, not tachycardia back in December.). Negative for chest pain, leg swelling and PND.  Gastrointestinal: Negative for abdominal pain, blood in stool and melena.  Genitourinary: Negative for hematuria.  Musculoskeletal: Positive for back pain and joint pain (Diffuse). Negative for myalgias.  Neurological: Positive for dizziness (Early on, but not now.) and weakness. Negative for focal weakness and loss of consciousness.  Psychiatric/Behavioral: Negative for depression and memory loss. The patient is nervous/anxious and has insomnia.        See Epworth score     EPWORTH SCORE Office Visit from 12/27/2020 in [place]   12/27/2020   2113   How likely are you to doze off or fall asleep in the following situations?   Sitting and reading? Moderate chance of dozing  Watching TV? High chance of dozing   Sitting, inactive in a public place (e.g. a theatre or a meeting)? Slight chance of dozing  As a passenger in a car for an hour without a break? Moderate chance of dozing  Lying down to rest in the afternoon when circumstances permit? High chance of dozing  Sitting and talking to someone? Would never doze  Sitting quietly after a lunch without alcohol? High chance of dozing  In a car, while stopped for a few minutes in traffic?  Would never doze  Epworth Total Score 15  Document the patient's comorbid illnesses and symptoms of sleep apnea   The patient has the following comorbid illnesses: Cardiac Arrhythmia  The patient has the following symptoms of sleep apnea: Daytime Sleepiness; Snoring; Morning Headaches; Un-refreshing Sleep; Witnessed Apnea    I have reviewed and (if needed) personally updated the patient's problem list, medications, allergies, past medical and surgical history, social and family history.   PAST MEDICAL HISTORY   Past Medical History:  Diagnosis Date  . Anxiety disorder   . COVID-19 virus infection 11/08/2020   Cough and congestion for several weeks with 4 to 5 days worsening.  Marland Kitchen DJD (degenerative joint disease), lumbosacral   . Hyperlipidemia due to dietary fat intake   . Nephrolithiasis   . Osteoarthritis involving multiple joints on both sides of body   . Persistent atrial fibrillation ([place]) 10/2020   Initial diagnosis with the setting of COVID-19 infection  . RLS (restless legs syndrome)     PAST SURGICAL HISTORY   Past Surgical History:  Procedure Laterality Date  . BACK SURGERY  1996   Lumbosacral spine--two surgeries  . CARPAL TUNNEL RELEASE Bilateral   . SHOULDER SURGERY Bilateral     Immunization History  Administered Date(s) Administered  . Influenza,inj,Quad PF,6+ Mos 08/28/2019    MEDICATIONS/ALLERGIES   Current Meds  Medication Sig  . dronedarone (MULTAQ) 400 MG tablet Take 1 tablet (400 mg total) by mouth 2 (two) times daily  with a meal.  . metoprolol tartrate (LOPRESSOR) 50 MG tablet Take 1 tablet (50 mg total) by mouth 2 (two) times daily.  Marland Kitchen venlafaxine (EFFEXOR) 75 MG tablet Take 75 mg by mouth 2 (two) times daily.  . [DISCONTINUED] apixaban (ELIQUIS) 5 MG TABS tablet Take 1 tablet (5 mg total) by mouth 2 (two) times daily.  . [DISCONTINUED] metoprolol tartrate (LOPRESSOR) 25 MG tablet Take 2 tablets (50 mg total) by mouth 2 (two) times daily.  Metoprolol and Eliquis started in December 2021  No Known Allergies  SOCIAL HISTORY/FAMILY  HISTORY   Reviewed in Epic:  Pertinent findings:  Social History   Tobacco Use  . Smoking status: Never Smoker  . Smokeless tobacco: Never Used  Substance Use Topics  . Alcohol use: Yes    Comment: occ  . Drug use: Never   Social History   Social History Narrative   Married father of two sons-28 and 30.  He lives with his wife in [place].   He drinks maybe 4-5 beers a day for "pain medication"    His job as a working in shift, often working night shift.   Does not regularly exercise per se. ->  His work involves doing jobs around farm.  Unfortunately hasn't been all that active recently since his COVID-19 infection, and has been scared to be very aggressive with being on Eliquis.    OBJCTIVE -PE, EKG, labs   Wt Readings from Last 3 Encounters:  12/27/20 (!) 311 lb (141.1 kg)  11/08/20 279 lb 15.8 oz (127 kg)  11/08/20 280 lb (127 kg)    Physical Exam: BP 138/90   Pulse 96   Ht 6\' 2"  (1.88 m)   Wt (!) 311 lb (141.1 kg)   BMI 39.93 kg/m  Physical Exam Constitutional:      General: He is not in acute distress.    Appearance: Normal appearance. He is obese. He is not ill-appearing or toxic-appearing.     Comments: With risk factors, BMI of 39 =morbid obesity.  HENT:     Head: Normocephalic and atraumatic.  Neck:     Vascular: No carotid bruit, hepatojugular reflux or JVD.  Cardiovascular:     Rate and Rhythm: Normal rate. Rhythm irregularly  irregular.     Pulses: Normal pulses.     Heart sounds: S2 normal. Heart sounds are distant. No murmur heard. No friction rub. No gallop.   Pulmonary:     Effort: Pulmonary effort is normal. No respiratory distress.     Breath sounds: Normal breath sounds.  Chest:     Chest wall: No tenderness.  Abdominal:     General: Bowel sounds are normal. There is no distension.     Palpations: Abdomen is soft. There is no mass.     Comments: No HSM or bruit; truncal obesity  Musculoskeletal:        General: No swelling. Normal range of motion.     Cervical back: Normal range of motion and neck supple.  Neurological:     General: No focal deficit present.     Mental Status: He is alert and oriented to person, place, and time.     Cranial Nerves: No cranial nerve deficit.     Motor: No weakness.     Gait: Gait normal.  Psychiatric:        Mood and Affect: Mood normal.        Behavior: Behavior normal.        Thought Content: Thought content normal.        Judgment: Judgment normal.     Adult ECG Report  Rate: 96 ;  Rhythm: atrial fibrillation;   Narrative Interpretation: Otherwise relatively normal.  Recent Labs: March 18, 2020: TC 11/20/2016, TG 89, HDL 44, LDL 182. No results found for: CHOL, HDL, LDLCALC, LDLDIRECT, TRIG, CHOLHDL Lab Results  Component Value Date   CREATININE 1.01 11/08/2020   BUN 20 11/08/2020   NA 139 11/08/2020   K 3.3 (L) 11/08/2020   CL 104 11/08/2020   CO2 24 11/08/2020  CBC Latest Ref Rng & Units 11/08/2020  WBC 4.0 - 10.5 K/uL 5.4  Hemoglobin 13.0 - 17.0 g/dL 15.3  Hematocrit 39.0 - 52.0 % 44.8  Platelets 150 - 400 K/uL 174    No results found for: TSH  ==================================================  COVID-19 Education: The signs and symptoms of COVID-19 were discussed with the patient and how to seek care for testing (follow up with PCP or arrange E-visit).   The importance of social distancing and COVID-19 vaccination was discussed  today. The patient is practicing social distancing & Masking.   I spent a total of 52 minutes with the patient spent in direct patient consultation.  -> Detailed description of atrial fibrillation and the pathophysiology as well as treatment options. Additional time spent with chart review  / charting (studies, outside notes, etc): 25 min; extensive chart review with medical history and surgical history updated. Total Time: 77 min   Current medicines are reviewed at length with the patient today.  (+/- concerns) N/A  This visit occurred during the SARS-CoV-2 public health emergency.  Safety protocols were in place, including screening questions prior to the visit, additional usage of staff PPE, and extensive cleaning of exam room while observing appropriate contact time as indicated for disinfecting solutions.  Notice: This dictation was prepared with Dragon dictation along with smaller phrase technology. Any transcriptional errors that result from this process are unintentional and may not be corrected upon review.  Patient Instructions / Medication Changes & Studies & Tests Ordered   Patient Instructions  Medication Instructions:   start  Taking  Multaq 400 mg one tablet twice a day   Increase Metoprolol tartrate to 50 mg one tablet twice a day   *If you need a refill on your cardiac medications before your next appointment, please call your pharmacy*   Lab Work: Cbc  bmp You will need a COVID-19  test prior to your procedure. You are scheduled for  Feb 21 ,2022  at PM. This is a Drive Up Visit at [address] [place], [place] [address]. Someone will direct you to the appropriate testing line. Stay in your car and someone will be with you shortly.   If you have labs (blood work) drawn today and your tests are completely normal, you will receive your results only by: Marland Kitchen MyChart Message (if you have MyChart) OR . A paper copy in the mail If you have any lab test that is abnormal  or we need to change your treatment, we will call you to review the results.   Testing/Procedures: WILL BE SCHEDULE ON Jan 12, 2021 3 DAYS PRIOR TO CARDIOVERSION  - NEED COVID -TEST Your physician has recommended that you have a Cardioversion (DCCV). Electrical Cardioversion uses a jolt of electricity to your heart either through paddles or wired patches attached to your chest. This is a controlled, usually prescheduled, procedure. Defibrillation is done under light anesthesia in the hospital, and you usually go home the day of the procedure. This is done to get your heart back into a normal rhythm. You are not awake for the procedure. Please see the instruction sheet given to you today.    MONITOR WILL BE MAILED TO YOU Your physician has recommended that you wear an  30 DAY event monitor  WILL BE PLACED AFTER CARDIOVERSION . Event monitors are medical devices that record the heart's electrical activity. Doctors most often [place] these monitors to diagnose arrhythmias. Arrhythmias are problems with the speed or rhythm of the  heartbeat. The monitor is a small, portable device. You can wear one while you do your normal daily activities. This is usually used to diagnose what is causing palpitations/syncope (passing out).   WILL BE SCHEDULE AFTER INSURANCE AUTHORIZATION Your physician has recommended that you have a sleep study. This test records several body functions during sleep, including: brain activity, eye movement, oxygen and carbon dioxide blood levels, heart rate and rhythm, breathing rate and rhythm, the flow of air through your mouth and nose, snoring, body muscle movements, and chest and belly movement.   [place] has requested that you have a lexiscan myoview. Please follow instruction sheet, as given.     Follow-Up: At [facility], you and your health needs are our priority.  As part of our continuing mission to provide you with exceptional heart  care, we have created designated Provider Care Teams.  These Care Teams include your primary Cardiologist (physician) and Advanced Practice Providers (APPs -  Physician Assistants and Nurse Practitioners) who all work together to provide you with the care you need, when you need it.  We recommend signing up for the patient portal called "MyChart".  Sign up information is provided on this After Visit Summary.  MyChart is used to connect with patients for Virtual Visits (Telemedicine).  Patients are able to view lab/test results, encounter notes, upcoming appointments, etc.  Non-urgent messages can be sent to your provider as well.   To learn more about what you can do with MyChart, go to [email or web address].    Your next appointment:   3 month(s)  The format for your next appointment:   In Person  Provider:   Glenetta Hew, MD   Other Instructions Your physician recommends that you schedule a follow-up appointment in:  2 MONTHS WITH AFIB CLINIC - [place].   Cut down on alcohol consumption   [employer] Cardiac Event Monitor Instructions Your physician has requested you wear your cardiac event monitor for _30____ days, (1-30). [employer] may call or text to confirm a shipping address. The monitor will be sent to a land address via UPS. [employer] will not ship a monitor to a PO BOX. It typically takes 3-5 days to receive your monitor after it has been enrolled. [employer] will assist with USPS tracking if your package is delayed. The telephone number for [employer] is [phone]. Once you have received your monitor, please review the enclosed instructions. Instruction tutorials can also be viewed under help and settings on the enclosed cell phone. Your monitor has already been registered assigning a specific monitor serial # to you.  Applying the monitor Remove cell phone from case and turn it on. The  cell phone works as Dealer and needs to be within [employer] of you at all times. The cell phone will need to be charged on a daily basis. We recommend you plug the cell phone into the enclosed charger at your bedside table every night.  Monitor batteries: You will receive two monitor batteries labelled #1 and #2. These are your recorders. Plug battery #2 onto the second connection on the enclosed charger. Keep one battery on the charger at all times. This will keep the monitor battery deactivated. It will also keep it fully charged for when you need to switch your monitor batteries. A small light will be blinking on the battery emblem when it is charging. The  light on the battery emblem will remain on when the battery is fully charged.  Open package of a Monitor strip. Insert battery #1 into black hood on strip and gently squeeze monitor battery onto connection as indicated in instruction booklet. Set aside while preparing skin.  Choose location for your strip, vertical or horizontal, as indicated in the instruction booklet. Shave to remove all hair from location. There cannot be any lotions, oils, powders, or colognes on skin where monitor is to be applied. Wipe skin clean with enclosed Saline wipe. Dry skin completely.  Peel paper labeled #1 off the back of the Monitor strip exposing the adhesive. Place the monitor on the chest in the vertical or horizontal position shown in the instruction booklet. One arrow on the monitor strip must be pointing upward. Carefully remove paper labeled #2, attaching remainder of strip to your skin. Try not to create any folds or wrinkles in the strip as you apply it.  Firmly press and release the circle in the center of the monitor battery. You will hear a small beep. This is turning the monitor battery on. The heart emblem on the monitor battery will light up every 5 seconds if the monitor battery in turned on and connected to the patient securely.  Do not push and hold the circle down as this turns the monitor battery off. The cell phone will locate the monitor battery. A screen will appear on the cell phone checking the connection of your monitor strip. This may read poor connection initially but change to good connection within the next minute. Once your monitor accepts the connection you will hear a series of 3 beeps followed by a climbing crescendo of beeps. A screen will appear on the cell phone showing the two monitor strip placement options. Touch the picture that demonstrates where you applied the monitor strip.  Your monitor strip and battery are waterproof. You are able to shower, bathe, or swim with the monitor on. They just ask you do not submerge deeper than 3 feet underwater. We recommend removing the monitor if you are swimming in a lake, river, or ocean.  Your monitor battery will need to be switched to a fully charged monitor battery approximately once a week. The cell phone will alert you of an action which needs to be made.  On the cell phone, tap for details to reveal connection status, monitor battery status, and cell phone battery status. The green dots indicates your monitor is in good status. A red dot indicates there is something that needs your attention.  To record a symptom, click the circle on the monitor battery. In 30-60 seconds a list of symptoms will appear on the cell phone. Select your symptom and tap save. Your monitor will record a sustained or significant arrhythmia regardless of you clicking the button. Some patients do not feel the heart rhythm irregularities. [employer] will notify us of any serious or critical events.  Refer to instruction booklet for instructions on switching batteries, changing strips, the Do not disturb or Pause features, or any additional questions.  Call [employer] at [phone], to confirm your monitor is transmitting and record your baseline. They will answer any  questions you may have regarding the monitor instructions at that time.  Returning the monitor to [place] all equipment back into blue box. Peel off strip of paper to expose adhesive and close box securely. There is a prepaid UPS shipping label on this box. Drop in a UPS drop box, or  at a [place]. You may also contact [employer] to arrange UPS to pick up monitor package at your home.      Dear  Clarisa Schools are scheduled for a Cardioversion on Feb 24,2022 with Dr. Meda Coffee .  Please arrive at the [facility] (Main Entrance A) at [facility]: [address] [place], [place] [address] at  9 am.  DIET: Nothing to eat or drink after midnight except a sip of water with medications (see medication instructions below)  Medication Instructions:  Continue your anticoagulant: Eliquis  You will need to continue your anticoagulant after your procedure until you are told by your  Provider that it is safe to stop   Labs:  For patients receiving anesthesia for TEE and all Cardioversion patients: BMET, CBC within 1 week can be done in [place] [facility]   and You will need a COVID-19  test prior to your procedure. You are scheduled for Jan 09, 2021 at  1:40  PM. This is a Drive Up Visit at [address] [place], [place] [address]. Someone will direct you to the appropriate testing line. Stay in your car and someone will be with you shortly.    You must have a responsible person to drive you home and stay in the waiting area during your procedure. Failure to do so could result in cancellation.  Bring your insurance cards.  *Special Note: Every effort is made to have your procedure done on time. Occasionally there are emergencies that occur at the hospital that may cause delays. Please be patient if a delay does occur.         Studies Ordered:   Orders Placed This Encounter  Procedures  . Basic metabolic panel  . CBC  . Amb Referral to AFIB Clinic   . MYOCARDIAL PERFUSION IMAGING  . CARDIAC EVENT MONITOR  . EKG 12-Lead  . Split night study     Glenetta Hew, M.D., M.S. Interventional Cardiologist   Pager # [phone] Phone # [phone] [address] [place], [place] [address]   Thank you for choosing [facility] at [facility]!!

## 2020-12-27 NOTE — Assessment & Plan Note (Signed)
Epworth score is 14-15.  One risk factor is having new onset A. fib.  We'll order polysomnogram.

## 2020-12-27 NOTE — Assessment & Plan Note (Signed)
LDL is 182.  I suspect he is getting a statin.  We can discuss once we have the results of the stress test.

## 2021-01-04 ENCOUNTER — Other Ambulatory Visit: Payer: Self-pay | Admitting: *Deleted

## 2021-01-04 DIAGNOSIS — I4891 Unspecified atrial fibrillation: Secondary | ICD-10-CM

## 2021-01-04 DIAGNOSIS — R0600 Dyspnea, unspecified: Secondary | ICD-10-CM

## 2021-01-04 DIAGNOSIS — R0609 Other forms of dyspnea: Secondary | ICD-10-CM

## 2021-01-04 NOTE — Progress Notes (Signed)
Orders and signature needs to completed Dr Ellyn Hack

## 2021-01-06 ENCOUNTER — Telehealth (HOSPITAL_COMMUNITY): Payer: Self-pay | Admitting: *Deleted

## 2021-01-06 NOTE — Telephone Encounter (Signed)
Close encounter 

## 2021-01-07 NOTE — Progress Notes (Signed)
Pt called and informed not to show for his covid test on Mon 01/09/21 due to pt testing + for covid on 11/08/20(results in Epic). Based on the guidelines the pt is in the 90 day window to not retest. The pt is still expected to quarantine until their procedure. Therefore, the pt can still have the scheduled procedure.

## 2021-01-09 ENCOUNTER — Other Ambulatory Visit (HOSPITAL_COMMUNITY): Payer: 59

## 2021-01-09 DIAGNOSIS — I4891 Unspecified atrial fibrillation: Secondary | ICD-10-CM | POA: Diagnosis not present

## 2021-01-10 ENCOUNTER — Ambulatory Visit (HOSPITAL_COMMUNITY)
Admission: RE | Admit: 2021-01-10 | Discharge: 2021-01-10 | Disposition: A | Discharge status: Home / Self Care | Payer: 59 | Source: Ambulatory Visit | Attending: Cardiovascular Disease | Admitting: Cardiovascular Disease

## 2021-01-10 ENCOUNTER — Other Ambulatory Visit: Payer: Self-pay

## 2021-01-10 DIAGNOSIS — R0600 Dyspnea, unspecified: Secondary | ICD-10-CM | POA: Diagnosis not present

## 2021-01-10 DIAGNOSIS — I4891 Unspecified atrial fibrillation: Secondary | ICD-10-CM | POA: Diagnosis not present

## 2021-01-10 DIAGNOSIS — R0609 Other forms of dyspnea: Secondary | ICD-10-CM

## 2021-01-10 LAB — CBC
Hematocrit: 44 % (ref 37.5–51.0)
Hemoglobin: 15 g/dL (ref 13.0–17.7)
MCH: 30.4 pg (ref 26.6–33.0)
MCHC: 34.1 g/dL (ref 31.5–35.7)
MCV: 89 fL (ref 79–97)
Platelets: 205 10*3/uL (ref 150–450)
RBC: 4.93 x10E6/uL (ref 4.14–5.80)
RDW: 14.2 % (ref 11.6–15.4)
WBC: 7.5 10*3/uL (ref 3.4–10.8)

## 2021-01-10 LAB — BASIC METABOLIC PANEL
BUN/Creatinine Ratio: 9 (ref 9–20)
BUN: 10 mg/dL (ref 6–24)
CO2: 22 mmol/L (ref 20–29)
Calcium: 9.1 mg/dL (ref 8.7–10.2)
Chloride: 104 mmol/L (ref 96–106)
Creatinine, Ser: 1.1 mg/dL (ref 0.76–1.27)
GFR calc Af Amer: 86 mL/min/{1.73_m2} (ref >59)
GFR calc non Af Amer: 74 mL/min/{1.73_m2} (ref >59)
Glucose: 112 mg/dL — ABNORMAL HIGH (ref 65–99)
Potassium: 4.4 mmol/L (ref 3.5–5.2)
Sodium: 141 mmol/L (ref 134–144)

## 2021-01-10 MED ORDER — TECHNETIUM TC 99M TETROFOSMIN IV KIT
30.5000 | PACK | Freq: Once | INTRAVENOUS | Status: AC | PRN
  Administered 2021-01-10: 30.5 via INTRAVENOUS
  Filled 2021-01-10: qty 31

## 2021-01-10 MED ORDER — REGADENOSON 0.4 MG/5ML IV SOLN
0.4000 mg | Freq: Once | INTRAVENOUS | Status: AC
  Administered 2021-01-10: 0.4 mg via INTRAVENOUS

## 2021-01-11 ENCOUNTER — Ambulatory Visit (HOSPITAL_COMMUNITY)
Admission: RE | Admit: 2021-01-11 | Discharge: 2021-01-11 | Disposition: A | Discharge status: Home / Self Care | Payer: 59 | Source: Ambulatory Visit | Attending: Cardiovascular Disease | Admitting: Cardiovascular Disease

## 2021-01-11 HISTORY — PX: NM MYOVIEW LTD: HXRAD82

## 2021-01-11 LAB — MYOCARDIAL PERFUSION IMAGING
Peak HR: 82 {beats}/min
Rest HR: 66 {beats}/min
SDS: 4
SRS: 0
SSS: 4
TID: 1.03

## 2021-01-11 MED ORDER — TECHNETIUM TC 99M TETROFOSMIN IV KIT
30.7000 | PACK | Freq: Once | INTRAVENOUS | Status: AC | PRN
  Administered 2021-01-11: 30.7 via INTRAVENOUS

## 2021-01-12 ENCOUNTER — Encounter (HOSPITAL_COMMUNITY)
Admission: RE | Disposition: A | Discharge status: Home / Self Care | Payer: Self-pay | Source: Home / Self Care | Attending: Cardiology

## 2021-01-12 ENCOUNTER — Other Ambulatory Visit: Payer: Self-pay

## 2021-01-12 ENCOUNTER — Encounter (HOSPITAL_COMMUNITY): Payer: Self-pay | Admitting: Cardiology

## 2021-01-12 ENCOUNTER — Ambulatory Visit (HOSPITAL_COMMUNITY): Payer: 59 | Admitting: Anesthesiology

## 2021-01-12 ENCOUNTER — Ambulatory Visit (HOSPITAL_COMMUNITY)
Admission: RE | Admit: 2021-01-12 | Discharge: 2021-01-12 | Disposition: A | Discharge status: Home / Self Care | Payer: 59 | Attending: Cardiology | Admitting: Cardiology

## 2021-01-12 DIAGNOSIS — Z79899 Other long term (current) drug therapy: Secondary | ICD-10-CM | POA: Diagnosis not present

## 2021-01-12 DIAGNOSIS — R69 Illness, unspecified: Secondary | ICD-10-CM | POA: Diagnosis not present

## 2021-01-12 DIAGNOSIS — I4819 Other persistent atrial fibrillation: Secondary | ICD-10-CM | POA: Diagnosis not present

## 2021-01-12 DIAGNOSIS — I4891 Unspecified atrial fibrillation: Secondary | ICD-10-CM | POA: Diagnosis not present

## 2021-01-12 DIAGNOSIS — Z8616 Personal history of COVID-19: Secondary | ICD-10-CM | POA: Insufficient documentation

## 2021-01-12 DIAGNOSIS — I10 Essential (primary) hypertension: Secondary | ICD-10-CM | POA: Diagnosis not present

## 2021-01-12 HISTORY — PX: CARDIOVERSION: SHX1299

## 2021-01-12 HISTORY — DX: Other complications of anesthesia, initial encounter: T88.59XA

## 2021-01-12 SURGERY — CARDIOVERSION
Anesthesia: General

## 2021-01-12 MED ORDER — PROPOFOL 10 MG/ML IV BOLUS
INTRAVENOUS | Status: DC | PRN
  Administered 2021-01-12: 100 mg via INTRAVENOUS

## 2021-01-12 MED ORDER — SODIUM CHLORIDE 0.9 % IV SOLN: INTRAVENOUS | Status: DC | PRN

## 2021-01-12 MED ORDER — LIDOCAINE 2% (20 MG/ML) 5 ML SYRINGE
INTRAMUSCULAR | Status: DC | PRN
  Administered 2021-01-12: 100 mg via INTRAVENOUS

## 2021-01-12 NOTE — Anesthesia Preprocedure Evaluation (Addendum)
Anesthesia Evaluation  Patient identified by MRN, date of birth, ID band Patient awake    Reviewed: Allergy & Precautions, NPO status , Patient's Chart, lab work & pertinent test results  History of Anesthesia Complications Negative for: history of anesthetic complications  Airway Mallampati: III  TM Distance: >3 FB Neck ROM: Full    Dental no notable dental hx. (+) Dental Advisory Given   Pulmonary neg pulmonary ROS,    Pulmonary exam normal        Cardiovascular hypertension, Pt. on home beta blockers + dysrhythmias Atrial Fibrillation  Rhythm:Irregular Rate:Normal  IMPRESSIONS    1. Left ventricular ejection fraction, by estimation, is 65 to 70%. The  left ventricle has hyperdynamic function. The left ventricle has no  regional wall motion abnormalities. There is mild left ventricular  hypertrophy. Left ventricular diastolic  parameters are indeterminate.  2. Right ventricular systolic function is normal. The right ventricular  size is normal.  3. Left atrial size was moderately dilated.  4. The mitral valve is normal in structure. No evidence of mitral valve  regurgitation. No evidence of mitral stenosis.  5. The aortic valve has an indeterminant number of cusps. Aortic valve  regurgitation is not visualized. No aortic stenosis is present.  6. The inferior vena cava is normal in size with greater than 50%  respiratory variability, suggesting right atrial pressure of 3 mmHg.    Neuro/Psych PSYCHIATRIC DISORDERS Anxiety negative neurological ROS     GI/Hepatic negative GI ROS, Neg liver ROS,   Endo/Other  Morbid obesity  Renal/GU negative Renal ROS     Musculoskeletal negative musculoskeletal ROS (+)   Abdominal   Peds  Hematology negative hematology ROS (+)   Anesthesia Other Findings   Reproductive/Obstetrics                            Anesthesia Physical Anesthesia  Plan  ASA: III  Anesthesia Plan: General   Post-op Pain Management:    Induction: Intravenous  PONV Risk Score and Plan: 2 and TIVA, Propofol infusion and Treatment may vary due to age or medical condition  Airway Management Planned: Mask  Additional Equipment:   Intra-op Plan:   Post-operative Plan:   Informed Consent: I have reviewed the patients History and Physical, chart, labs and discussed the procedure including the risks, benefits and alternatives for the proposed anesthesia with the patient or authorized representative who has indicated his/her understanding and acceptance.     Dental advisory given  Plan Discussed with: Anesthesiologist and CRNA  Anesthesia Plan Comments:        Anesthesia Quick Evaluation

## 2021-01-12 NOTE — Anesthesia Postprocedure Evaluation (Signed)
Anesthesia Post Note  Patient: MEIKO STRANAHAN  Procedure(s) Performed: CARDIOVERSION (N/A )     Patient location during evaluation: PACU Anesthesia Type: General Level of consciousness: sedated Pain management: pain level controlled Vital Signs Assessment: post-procedure vital signs reviewed and stable Respiratory status: spontaneous breathing and respiratory function stable Cardiovascular status: stable Postop Assessment: no apparent nausea or vomiting Anesthetic complications: no   No complications documented.  Last Vitals:  Vitals:   01/12/21 1020 01/12/21 1030  BP: 102/71 108/78  Pulse: (!) 57 (!) 57  Resp: 13 20  Temp:    SpO2: 98% 97%    Last Pain:  Vitals:   01/12/21 1030  TempSrc:   PainSc: 0-No pain                 Gareth Fitzner,Graiden DANIEL

## 2021-01-12 NOTE — Discharge Instructions (Signed)
Electrical Cardioversion Electrical cardioversion is the delivery of a jolt of electricity to restore a normal rhythm to the heart. A rhythm that is too fast or is not regular keeps the heart from pumping well. In this procedure, sticky patches or metal paddles are placed on the chest to deliver electricity to the heart from a device. This procedure may be done in an emergency if:  There is low or no blood pressure as a result of the heart rhythm.  Normal rhythm must be restored as fast as possible to protect the brain and heart from further damage.  It may save a life. This may also be a scheduled procedure for irregular or fast heart rhythms that are not immediately life-threatening. Tell a health care provider about:  Any allergies you have.  All medicines you are taking, including vitamins, herbs, eye drops, creams, and over-the-counter medicines.  Any problems you or family members have had with anesthetic medicines.  Any blood disorders you have.  Any surgeries you have had.  Any medical conditions you have.  Whether you are pregnant or may be pregnant. What are the risks? Generally, this is a safe procedure. However, problems may occur, including:  Allergic reactions to medicines.  A blood clot that breaks free and travels to other parts of your body.  The possible return of an abnormal heart rhythm within hours or days after the procedure.  Your heart stopping (cardiac arrest). This is rare. What happens before the procedure? Medicines  Your health care provider may have you start taking: ? Blood-thinning medicines (anticoagulants) so your blood does not clot as easily. ? Medicines to help stabilize your heart rate and rhythm.  Ask your health care provider about: ? Changing or stopping your regular medicines. This is especially important if you are taking diabetes medicines or blood thinners. ? Taking medicines such as aspirin and ibuprofen. These medicines can  thin your blood. Do not take these medicines unless your health care provider tells you to take them. ? Taking over-the-counter medicines, vitamins, herbs, and supplements. General instructions  Follow instructions from your health care provider about eating or drinking restrictions.  Plan to have someone take you home from the hospital or clinic.  If you will be going home right after the procedure, plan to have someone with you for 24 hours.  Ask your health care provider what steps will be taken to help prevent infection. These may include washing your skin with a germ-killing soap. What happens during the procedure?  An IV will be inserted into one of your veins.  Sticky patches (electrodes) or metal paddles may be placed on your chest.  You will be given a medicine to help you relax (sedative).  An electrical shock will be delivered. The procedure may vary among health care providers and hospitals.   What can I expect after the procedure?  Your blood pressure, heart rate, breathing rate, and blood oxygen level will be monitored until you leave the hospital or clinic.  Your heart rhythm will be watched to make sure it does not change.  You may have some redness on the skin where the shocks were given. Follow these instructions at home:  Do not drive for 24 hours if you were given a sedative during your procedure.  Take over-the-counter and prescription medicines only as told by your health care provider.  Ask your health care provider how to check your pulse. Check it often.  Rest for 48 hours after the procedure   or as told by your health care provider.  Avoid or limit your caffeine use as told by your health care provider.  Keep all follow-up visits as told by your health care provider. This is important. Contact a health care provider if:  You feel like your heart is beating too quickly or your pulse is not regular.  You have a serious muscle cramp that does not go  away. Get help right away if:  You have discomfort in your chest.  You are dizzy or you feel faint.  You have trouble breathing or you are short of breath.  Your speech is slurred.  You have trouble moving an arm or leg on one side of your body.  Your fingers or toes turn cold or blue. Summary  Electrical cardioversion is the delivery of a jolt of electricity to restore a normal rhythm to the heart.  This procedure may be done right away in an emergency or may be a scheduled procedure if the condition is not an emergency.  Generally, this is a safe procedure.  After the procedure, check your pulse often as told by your health care provider. This information is not intended to replace advice given to you by your health care provider. Make sure you discuss any questions you have with your health care provider. Document Revised: 06/08/2019 Document Reviewed: 06/08/2019 Elsevier Patient Education  2021 [employer]  

## 2021-01-12 NOTE — Transfer of Care (Signed)
Immediate Anesthesia Transfer of Care Note  Patient: Michael Fuller  Procedure(s) Performed: CARDIOVERSION (N/A )  Patient Location: Endoscopy Unit  Anesthesia Type:General  Level of Consciousness: awake, alert  and oriented  Airway & Oxygen Therapy: Patient Spontanous Breathing and Patient connected to nasal cannula oxygen  Post-op Assessment: Report given to RN, Post -op Vital signs reviewed and stable and Patient moving all extremities  Post vital signs: Reviewed and stable  Last Vitals:  Vitals Value Taken Time  BP    Temp    Pulse    Resp    SpO2      Last Pain:  Vitals:   01/12/21 0914  TempSrc: Oral  PainSc: 0-No pain         Complications: No complications documented.

## 2021-01-12 NOTE — CV Procedure (Signed)
   Cardioversion Note  EDREES VALENT [id number] [birth year]  Procedure: DC Cardioversion Indications: atrial fibrillation  Procedure Details Consent: Obtained Time Out: Verified patient identification, verified procedure, site/side was marked, verified correct patient position, special equipment/implants available, Radiology Safety Procedures followed,  medications/allergies/relevent history reviewed, required imaging and test results available.  Performed  The patient has been on adequate anticoagulation.  The patient received IV propofol administered for anesthesia staff for sedation.  Synchronous cardioversion was performed at 200 joules.  The cardioversion was successful.  Complications: No apparent complications Patient did tolerate procedure well.  Ena Dawley, MD, [facility] 01/12/2021, 10:14 AM

## 2021-01-12 NOTE — Anesthesia Procedure Notes (Signed)
Procedure Name: General with mask airway Date/Time: 01/12/2021 9:55 AM Performed by: Amadeo Garnet, CRNA Pre-anesthesia Checklist: Patient identified, Emergency Drugs available, Suction available and Patient being monitored Patient Re-evaluated:Patient Re-evaluated prior to induction Oxygen Delivery Method: Ambu bag Preoxygenation: Pre-oxygenation with 100% oxygen Induction Type: IV induction Placement Confirmation: positive ETCO2 Dental Injury: Teeth and Oropharynx as per pre-operative assessment

## 2021-01-12 NOTE — Interval H&P Note (Signed)
History and Physical Interval Note:  01/12/2021 9:30 AM  Michael Fuller  has presented today for surgery, with the diagnosis of A-FIB.  The various methods of treatment have been discussed with the patient and family. After consideration of risks, benefits and other options for treatment, the patient has consented to  Procedure(s): CARDIOVERSION (N/A) as a surgical intervention.  The patient's history has been reviewed, patient examined, no change in status, stable for surgery.  I have reviewed the patient's chart and labs.  Questions were answered to the patient's satisfaction.     Ena Dawley

## 2021-01-13 ENCOUNTER — Ambulatory Visit (INDEPENDENT_AMBULATORY_CARE_PROVIDER_SITE_OTHER): Payer: 59

## 2021-01-13 ENCOUNTER — Telehealth: Payer: Self-pay | Admitting: *Deleted

## 2021-01-13 ENCOUNTER — Encounter (HOSPITAL_COMMUNITY): Payer: Self-pay | Admitting: Cardiology

## 2021-01-13 DIAGNOSIS — I4891 Unspecified atrial fibrillation: Secondary | ICD-10-CM

## 2021-01-13 NOTE — Telephone Encounter (Signed)
  Unable to leave message - voicemail is full Appointment hold for January 18, 2021 at 1:20 pm will contact patient

## 2021-01-13 NOTE — Telephone Encounter (Signed)
-----   Message from Leonie Man, MD sent at 01/13/2021  9:20 AM EST ----- Stress test results show a read as intermediate risk.  There does appear to be a possible heart artery blockage. In order to fully know how to treat the atrial fibrillation, we do probably need to investigate this further.  With this result, we should probably see Mr. Nest back within the next week or so to discuss results and next step recommendations - probably should pursue cardiac cath.  Glenetta Hew, MD

## 2021-01-13 NOTE — Telephone Encounter (Signed)
-----   Message from Leonie Man, MD sent at 01/13/2021  9:21 AM EST ----- Chemistry panel and CBC look relatively normal.  Blood glucose is elevated, but not as high as 2 months ago. Normal kidney function and electrolytes  Glenetta Hew, MD

## 2021-01-16 NOTE — Telephone Encounter (Signed)
Unable to leave message phone goes to voicemail . Voicemail is not set up . Will try again

## 2021-01-16 NOTE — Telephone Encounter (Signed)
The patient has been notified of the result and verbalized understanding.  All questions (if any) were answered. Follow up appt on 01/18/21 at 2:40 pm Raiford Simmonds, RN 01/16/2021 3:23 PM

## 2021-01-18 ENCOUNTER — Ambulatory Visit: Payer: 59 | Admitting: Cardiology

## 2021-01-18 ENCOUNTER — Ambulatory Visit (INDEPENDENT_AMBULATORY_CARE_PROVIDER_SITE_OTHER): Payer: 59 | Admitting: Cardiology

## 2021-01-18 ENCOUNTER — Encounter: Payer: Self-pay | Admitting: Cardiology

## 2021-01-18 ENCOUNTER — Other Ambulatory Visit: Payer: Self-pay

## 2021-01-18 VITALS — BP 128/76 | HR 60 | Ht 74.0 in | Wt 306.8 lb

## 2021-01-18 DIAGNOSIS — R0683 Snoring: Secondary | ICD-10-CM

## 2021-01-18 DIAGNOSIS — R0600 Dyspnea, unspecified: Secondary | ICD-10-CM

## 2021-01-18 DIAGNOSIS — R9439 Abnormal result of other cardiovascular function study: Secondary | ICD-10-CM | POA: Diagnosis not present

## 2021-01-18 DIAGNOSIS — R0609 Other forms of dyspnea: Secondary | ICD-10-CM

## 2021-01-18 DIAGNOSIS — I4819 Other persistent atrial fibrillation: Secondary | ICD-10-CM | POA: Diagnosis not present

## 2021-01-18 DIAGNOSIS — I10 Essential (primary) hypertension: Secondary | ICD-10-CM

## 2021-01-18 DIAGNOSIS — E7849 Other hyperlipidemia: Secondary | ICD-10-CM | POA: Diagnosis not present

## 2021-01-18 NOTE — Assessment & Plan Note (Signed)
He was in A. fib for the better part of 63months.  Successfully cardioverted on February 24. Currently on Xarelto.  Needs to stay uninterrupted until least March 24. -=> will be on hold at that point for cardiac catheterization.  On 50 mg twice daily metoprolol, Multaq.  Has event monitor in place to monitor his A. fib burden.  He is due to follow-up with me post cath but also to be seen in A. fib clinic.

## 2021-01-18 NOTE — Assessment & Plan Note (Signed)
Less notable following cardioversion.  However he now has an abnormal Myoview stress test.  Recommendation cardiac catheterization to exclude ischemic CAD.

## 2021-01-18 NOTE — H&P (View-Only) (Signed)
. Primary Care Provider: Asencion Noble, MD Cardiologist: Glenetta Hew, MD Electrophysiologist: None  Clinic Note: Chief Complaint  Patient presents with  . Atrial Fibrillation    S/P DCCV   . Follow-up    Myoview Results. - Abnormal   ===================================  ASSESSMENT/PLAN   Problem List Items Addressed This Visit    Loud snoring    Elevated Epworth Scale - Polysomnogram ordered. Wgt loss recommended.      Abnormal nuclear stress test    Myoview stress test ordered as part of ischemic evaluation for new onset A. fib.  He did have some chest discomfort while in A. fib, but no longer having it now.  Also has notable exertional dyspnea while in A. Fib. It is possible that the stress test is false positive given the location of the lesion, it is clearly reversible.  As such, cannot exclude an ischemic lesion. Recommendations cardiac catheterization. => Pending results of cardiac catheterization, will likely need to be more aggressive addressing lipids.  He is currently on noninterrupted Xarelto from his cardioversion on the 24th.  Will need to be uninterrupted until March 24. We will plan cardiac catheterization on March 25.  Shared Decision Making/Informed Consent{ All outpatient stress tests require an informed consent  The risks [stroke (1 in 1000), death (1 in 96), kidney failure [usually temporary] (1 in 500), bleeding (1 in 200), allergic reaction [possibly serious] (1 in 200)], benefits (diagnostic support and management of coronary artery disease) and alternatives of a cardiac catheterization (with possible PCI) were discussed in detail with Michael Fuller and he is willing to proceed.          Relevant Orders   EKG 12-Lead   CBC   Basic metabolic panel   Persistent atrial fibrillation (HCC)-new onset - Primary (Chronic)    He was in A. fib for the better part of 80months.  Successfully cardioverted on February 24. Currently on Xarelto.  Needs to stay  uninterrupted until least March 24. -=> will be on hold at that point for cardiac catheterization.  On 50 mg twice daily metoprolol, Multaq.  Has event monitor in place to monitor his A. fib burden.  He is due to follow-up with me post cath but also to be seen in A. fib clinic.      Relevant Orders   EKG 12-Lead   CBC   Basic metabolic panel   Essential hypertension (Chronic)    Blood pressure improved with increased dose of beta-blocker.  Tolerating well.  No change.  Next option will be ARB.      Hyperlipidemia due to dietary fat intake (Chronic)   Morbid obesity (HCC) (Chronic)    Thankfully, his lost\15 pounds he gained.  He understands the importance of tighter modification and exercise.  Need to exclude ischemic CAD in order to encourage more aggressive exercise.      DOE (dyspnea on exertion) (Chronic)    Less notable following cardioversion.  However he now has an abnormal Myoview stress test.  Recommendation cardiac catheterization to exclude ischemic CAD.      Relevant Orders   EKG 12-Lead   CBC   Basic metabolic panel     ===================================  HPI:    Michael Fuller is a 60 y.o. male with a PMH below who  is being seen today for the evaluation of [place] at the request of Asencion Noble, MD.  Michael Fuller was recently seen during hospitalization back in December 2021 by  Dr. Domenic Polite at [facility].  Patient was Covid positive-presented with progressively worsening dyspnea and chest discomfort.  Found to be in A. fib RVR converted with IV metoprolol.  Started on Eliquis.  He was originally scheduled to go to the A. fib clinic, however he ended up on my clinic schedule on February 8 at the request of Dr. Willey Fuller.. When he saw Dr. Willey Fuller in January for hospital follow-up he was premature asymptomatic, but still in A. fib.  Thyroid levels normal.  Rate was controlled on low-dose beta-blocker, he still felt short of  breath.  Was recovering from Covid infection.  Noted fatigue. => 12/27/2020: He really did not know any significant heart rate issues or palpitations.  He did feel the irregular pulses when he was laying down at night.  He noted more fatigue than usual and exertional dyspnea.  No chest pain.  Originally had PND orthopnea, but that improved.  He said his rates have been in pretty good control less than 100. =>   Started on Multaq 400 mg twice daily with plans to facilitate cardioversion  Metoprolol increased to 50 mg twice daily.  Myoview ordered for ischemic evaluation given initial symptoms. => Completed February 22  Schedule for DCCV February 24 = successful> restored NSR.  Plan for event monitor following DCCV to determine if he maintains sinus rhythm.  Discussed importance of weight loss.  Significantly elevated Epworth score noted. => Sleep study ordered.  Recent Hospitalizations:   DCCV 12/12/2020  Reviewed  CV studies:    The following studies were reviewed today: (if available, images/films reviewed: From Epic Chart or Care Everywhere) . Event monitor still pending . Myoview 01/11/2021: INTERMEDIATE RISK study consistent with ischemia.  Medium size mild severity reversible perfusion defect in the basal inferoseptal, mid inferoseptal and apical inferior location.  Unable to calculate EF and wall motion due to A. fib.  Interval History:   Michael Fuller presents here today for post DCCV f/u to discuss results of [place]. he is happy to announce that he has lost back some of pain pounds he gained from his hospitalization.  He does indicate feeling little better after his cardioversion.  He still has a little bit of fatigue and exertional dyspnea, and acknowledges that his weight is up.  He has better energy now and denies any real exertional dyspnea.  No PND or orthopnea. He says he feels different after starting medications but he is not sure whether this decrease metoprolol  dose or the Multaq.  He is still reluctant to exercise, especially since because of adequately about the Myoview.  He has not had any chest pain or pressure since being out of A. fib.  He denies any real PND orthopnea.  Trivial edema.  CV Review of Symptoms (Summary) Cardiovascular ROS: positive for - dyspnea on exertion and This is a definitely improved.  Is still present-probably because of deconditioning and weight. negative for - chest pain, edema, irregular heartbeat, orthopnea, palpitations, paroxysmal nocturnal dyspnea, rapid heart rate, shortness of breath or Notably less fatigue.  No syncope or near syncope, TIAs or amaurosis fugax.  The patient DOES NOT have symptoms concerning for COVID-19 infection (fever, chills, cough, or new shortness of breath).   REVIEWED OF SYSTEMS   Review of Systems  Constitutional: Positive for malaise/fatigue (This is improving the father when he gets on the [place].  He does notice it was improved also after cardioversion.Marland Kitchen) and weight loss (He had gained 15 pounds after his  hospitalization, but has now lost about half that back.Marland Kitchen).  HENT: Negative for congestion and nosebleeds.   Respiratory: Negative for shortness of breath (Definitely improved.).   Cardiovascular: Negative for chest pain, palpitations (He is no longer feeling the palpitations when he lays in his chest..), leg swelling and PND.  Gastrointestinal: Negative for abdominal pain, blood in stool and melena.  Genitourinary: Negative for hematuria.  Musculoskeletal: Positive for back pain and joint pain (Diffuse). Negative for myalgias.  Neurological: Negative for dizziness (Early on, but not now.), focal weakness, loss of consciousness and weakness.  Psychiatric/Behavioral: Negative for depression and memory loss. The patient has insomnia. The patient is not nervous/anxious.        See Epworth score    EPWORTH SCORE 12/27/2020 in [place]   12/27/2020  How likely are you to doze  off or fall asleep in the following situations?  Sitting and reading? Moderate chance of dozing  Watching TV? High chance of dozing  Sitting, inactive in a public place (e.g. a theatre or a meeting)? Slight chance of dozing  As a passenger in a car for an hour without a break? Moderate chance of dozing  Lying down to rest in the afternoon when circumstances permit? High chance of dozing  Sitting and talking to someone? Would never doze  Sitting quietly after a lunch without alcohol? High chance of dozing  In a car, while stopped for a few minutes in traffic?  Would never doze  Epworth Total Score 15  Document the patient's comorbid illnesses and symptoms of sleep apnea:   The patient has the following comorbid illnesses: Cardiac Arrhythmia  The patient has the following symptoms of sleep apnea: Daytime Sleepiness; Snoring; Morning Headaches; Un-refreshing Sleep; Witnessed Apnea    I have reviewed and (if needed) personally updated the patient's problem list, medications, allergies, past medical and surgical history, social and family history.   PAST MEDICAL HISTORY   Past Medical History:  Diagnosis Date  . Anxiety disorder   . Complication of anesthesia    [place]  . COVID-19 virus infection 11/08/2020   Cough and congestion for several weeks with 4 to 5 days worsening.  Marland Kitchen DJD (degenerative joint disease), lumbosacral   . Hyperlipidemia due to dietary fat intake   . Nephrolithiasis   . Osteoarthritis involving multiple joints on both sides of body   . Persistent atrial fibrillation ([place]) 10/2020   Initial diagnosis with the setting of COVID-19 infection  . RLS (restless legs syndrome)     PAST SURGICAL HISTORY   Past Surgical History:  Procedure Laterality Date  . BACK SURGERY  1996   Lumbosacral spine--two surgeries  . CARDIOVERSION N/A 01/12/2021   Procedure: SUCCESSFUL CARDIOVERSION;  Surgeon: Dorothy Spark, MD;  Location: [facility] ENDOSCOPY;  Service: Cardiovascular;   Laterality: N/A;  . CARPAL TUNNEL RELEASE Bilateral   . SHOULDER SURGERY Bilateral   . TRANSTHORACIC ECHOCARDIOGRAM  11/25/2020   (Post COVID & New Afib): EF 65 to 70%.  Hyperdynamic LV.  Normal diastolic pressures.  Moderate LA dilation.  Otherwise normal study.  Valves, normal RV/RA pressure.    Immunization History  Administered Date(s) Administered  . Influenza,inj,Quad PF,6+ Mos 08/28/2019    MEDICATIONS/ALLERGIES   Current Meds  Medication Sig  . dronedarone (MULTAQ) 400 MG tablet Take 1 tablet (400 mg total) by mouth 2 (two) times daily with a meal.  . metoprolol tartrate (LOPRESSOR) 50 MG tablet Take 1 tablet (50 mg total) by mouth 2 (two) times  daily.  . pramipexole (MIRAPEX) 0.5 MG tablet Take 0.5 mg by mouth daily.  . rivaroxaban (XARELTO) 20 MG TABS tablet Take 1 tablet (20 mg total) by mouth daily with supper.  . venlafaxine (EFFEXOR) 75 MG tablet Take 75 mg by mouth 2 (two) times daily.  Metoprolol and Eliquis started in December 2021  No Known Allergies  SOCIAL HISTORY/FAMILY HISTORY   Reviewed in Epic:  Pertinent findings:  Social History   Tobacco Use  . Smoking status: Never Smoker  . Smokeless tobacco: Never Used  Substance Use Topics  . Alcohol use: Yes    Comment: occ  . Drug use: Never   Social History   Social History Narrative   Married father of two sons-28 and 11.  He lives with his wife in [place].   He drinks maybe 4-5 beers a day for "pain medication"    His job as a working in shift, often working night shift.   Does not regularly exercise per se. ->  His work involves doing jobs around farm.  Unfortunately hasn't been all that active recently since his COVID-19 infection, and has been scared to be very aggressive with being on Eliquis.    OBJCTIVE -PE, EKG, labs   Wt Readings from Last 3 Encounters:  01/18/21 (!) 306 lb 12.8 oz (139.2 kg)  01/12/21 (!) 305 lb (138.3 kg)  01/10/21 (!) 311 lb (141.1 kg)    Physical Exam: BP  128/76   Pulse 60   Ht 6\' 2"  (1.88 m)   Wt (!) 306 lb 12.8 oz (139.2 kg)   SpO2 98%   BMI 39.39 kg/m  Physical Exam Constitutional:      General: He is not in acute distress.    Appearance: Normal appearance. He is obese. He is not ill-appearing or toxic-appearing.     Comments: With risk factors, BMI of 39 =morbid obesity.  HENT:     Head: Normocephalic and atraumatic.  Neck:     Vascular: No carotid bruit, hepatojugular reflux or JVD.  Cardiovascular:     Rate and Rhythm: Normal rate and regular rhythm.     Chest Wall: PMI is not displaced.     Pulses: No decreased pulses (Mostly because of body habitus).     Heart sounds: S2 normal. Heart sounds are distant. No murmur heard. No friction rub. No gallop.   Pulmonary:     Effort: Pulmonary effort is normal. No respiratory distress.     Breath sounds: Normal breath sounds.  Chest:     Chest wall: No tenderness.  Abdominal:     Comments: No HSM or bruit; truncal obesity  Musculoskeletal:        General: No swelling. Normal range of motion.     Cervical back: Normal range of motion and neck supple.  Neurological:     General: No focal deficit present.     Mental Status: He is alert and oriented to person, place, and time.     Cranial Nerves: No cranial nerve deficit.     Motor: No weakness.     Gait: Gait normal.  Psychiatric:        Mood and Affect: Mood normal.        Behavior: Behavior normal.        Thought Content: Thought content normal.        Judgment: Judgment normal.     Adult ECG Report  Rate: 60;  Rhythm: normal sinus rhythm;   Narrative Interpretation: Otherwise relatively normal.  Recent Labs: March 18, 2020: TC 11/20/2016, TG 89, HDL 44, LDL 182. No results found for: CHOL, HDL, LDLCALC, LDLDIRECT, TRIG, CHOLHDL Lab Results  Component Value Date   CREATININE 1.10 01/09/2021   BUN 10 01/09/2021   NA 141 01/09/2021   K 4.4 01/09/2021   CL 104 01/09/2021   CO2 22 01/09/2021   CBC Latest Ref Rng &  Units 01/09/2021 11/08/2020  WBC 3.4 - 10.8 x10E3/uL 7.5 5.4  Hemoglobin 13.0 - 17.7 g/dL 15.0 15.3  Hematocrit 37.5 - 51.0 % 44.0 44.8  Platelets 150 - 450 x10E3/uL 205 174    No results found for: TSH  ==================================================  COVID-19 Education: The signs and symptoms of COVID-19 were discussed with the patient and how to seek care for testing (follow up with PCP or arrange E-visit).   The importance of social distancing and COVID-19 vaccination was discussed today. The patient is practicing social distancing & Masking.   I spent a total of 38 minutes with the patient spent in direct patient consultation.  -> Detailed description of stress test results and different options moving forward. Cardiac catheterization explanation and consent obtained.  Additional time spent with chart review  / charting (studies, outside notes, etc): 20 min Total Time: 58 min   Current medicines are reviewed at length with the patient today.  (+/- concerns) N/A  This visit occurred during the SARS-CoV-2 public health emergency.  Safety protocols were in place, including screening questions prior to the visit, additional usage of staff PPE, and extensive cleaning of exam room while observing appropriate contact time as indicated for disinfecting solutions.  Notice: This dictation was prepared with Dragon dictation along with smaller phrase technology. Any transcriptional errors that result from this process are unintentional and may not be corrected upon review.  Patient Instructions / Medication Changes & Studies & Tests Ordered   Patient Instructions  Medication Instructions:  No changes   *If you need a refill on your cardiac medications before your next appointment, please call your pharmacy*   Lab Work:  CBC For Cath  BMP  For Cath You will need a COVID-19  Test 3 days prior to your procedure cardiac cath .  This is a Drive Up Visit at [address]  [place], [place] [address]. Someone will direct you to the appropriate testing line. Stay in your car and someone will be with you shortly.  If you have labs (blood work) drawn today and your tests are completely normal, you will receive your results only by: Marland Kitchen MyChart Message (if you have MyChart) OR . A paper copy in the mail If you have any lab test that is abnormal or we need to change your treatment, we will call you to review the results.   Testing/Procedures:  will be schedule for March25 ,2022 - Your physician has requested that you have a cardiac catheterization. Cardiac catheterization is used to diagnose and/or treat various heart conditions. Doctors may recommend this procedure for a number of different reasons. The most common reason is to evaluate chest pain. Chest pain can be a symptom of coronary artery disease (CAD), and cardiac catheterization can show whether plaque is narrowing or blocking your heart's arteries. This procedure is also used to evaluate the valves, as well as measure the blood flow and oxygen levels in different parts of your heart. For further information please visit [email or web address]. Please follow instruction sheet, as given.    Follow-Up: At [facility], you and your health needs  are our priority.  As part of our continuing mission to provide you with exceptional heart care, we have created designated Provider Care Teams.  These Care Teams include your primary Cardiologist (physician) and Advanced Practice Providers (APPs -  Physician Assistants and Nurse Practitioners) who all work together to provide you with the care you need, when you need it.     Your next appointment:   keep appointment in April 2022 with Afib clinic and May 2022 with Dr Misa Fedorko  The format for your next appointment:   In Person  Provider:   Glenetta Hew, MD   Other Instructions Contact you with instruction for cardiac cath  Friday March 4 or March 7,2022    Studies Ordered:    Orders Placed This Encounter  Procedures  . CBC  . Basic metabolic panel  . EKG 12-Lead     Glenetta Hew, M.D., M.S. Interventional Cardiologist   Pager # [phone] Phone # [phone] [address] [place], [place] [address]   Thank you for choosing [facility] at [facility]!!

## 2021-01-18 NOTE — Progress Notes (Signed)
. Primary Care Provider: Asencion Noble, MD Cardiologist: Glenetta Hew, MD Electrophysiologist: None  Clinic Note: Chief Complaint  Patient presents with  . Atrial Fibrillation    S/P DCCV   . Follow-up    Myoview Results. - Abnormal   ===================================  ASSESSMENT/PLAN   Problem List Items Addressed This Visit    Loud snoring    Elevated Epworth Scale - Polysomnogram ordered. Wgt loss recommended.      Abnormal nuclear stress test    Myoview stress test ordered as part of ischemic evaluation for new onset A. fib.  He did have some chest discomfort while in A. fib, but no longer having it now.  Also has notable exertional dyspnea while in A. Fib. It is possible that the stress test is false positive given the location of the lesion, it is clearly reversible.  As such, cannot exclude an ischemic lesion. Recommendations cardiac catheterization. => Pending results of cardiac catheterization, will likely need to be more aggressive addressing lipids.  He is currently on noninterrupted Xarelto from his cardioversion on the 24th.  Will need to be uninterrupted until March 24. We will plan cardiac catheterization on March 25.  Shared Decision Making/Informed Consent{ All outpatient stress tests require an informed consent  The risks [stroke (1 in 1000), death (1 in 105), kidney failure [usually temporary] (1 in 500), bleeding (1 in 200), allergic reaction [possibly serious] (1 in 200)], benefits (diagnostic support and management of coronary artery disease) and alternatives of a cardiac catheterization (with possible PCI) were discussed in detail with Michael Fuller and he is willing to proceed.          Relevant Orders   EKG 12-Lead   CBC   Basic metabolic panel   Persistent atrial fibrillation (HCC)-new onset - Primary (Chronic)    He was in A. fib for the better part of 40months.  Successfully cardioverted on February 24. Currently on Xarelto.  Needs to stay  uninterrupted until least March 24. -=> will be on hold at that point for cardiac catheterization.  On 50 mg twice daily metoprolol, Multaq.  Has event monitor in place to monitor his A. fib burden.  He is due to follow-up with me post cath but also to be seen in A. fib clinic.      Relevant Orders   EKG 12-Lead   CBC   Basic metabolic panel   Essential hypertension (Chronic)    Blood pressure improved with increased dose of beta-blocker.  Tolerating well.  No change.  Next option will be ARB.      Hyperlipidemia due to dietary fat intake (Chronic)   Morbid obesity (HCC) (Chronic)    Thankfully, his lost\15 pounds he gained.  He understands the importance of tighter modification and exercise.  Need to exclude ischemic CAD in order to encourage more aggressive exercise.      DOE (dyspnea on exertion) (Chronic)    Less notable following cardioversion.  However he now has an abnormal Myoview stress test.  Recommendation cardiac catheterization to exclude ischemic CAD.      Relevant Orders   EKG 12-Lead   CBC   Basic metabolic panel     ===================================  HPI:    Michael Fuller is a 60 y.o. male with a PMH below who  is being seen today for the evaluation of [place] at the request of Asencion Noble, MD.  [name] was recently seen during hospitalization back in December 2021 by  Dr. Domenic Polite at [facility].  Patient was Covid positive-presented with progressively worsening dyspnea and chest discomfort.  Found to be in A. fib RVR converted with IV metoprolol.  Started on Eliquis.  He was originally scheduled to go to the A. fib clinic, however he ended up on my clinic schedule on February 8 at the request of Dr. Willey Blade.. When he saw Dr. Willey Blade in January for hospital follow-up he was premature asymptomatic, but still in A. fib.  Thyroid levels normal.  Rate was controlled on low-dose beta-blocker, he still felt short of  breath.  Was recovering from Covid infection.  Noted fatigue. => 12/27/2020: He really did not know any significant heart rate issues or palpitations.  He did feel the irregular pulses when he was laying down at night.  He noted more fatigue than usual and exertional dyspnea.  No chest pain.  Originally had PND orthopnea, but that improved.  He said his rates have been in pretty good control less than 100. =>   Started on Multaq 400 mg twice daily with plans to facilitate cardioversion  Metoprolol increased to 50 mg twice daily.  Myoview ordered for ischemic evaluation given initial symptoms. => Completed February 22  Schedule for DCCV February 24 = successful> restored NSR.  Plan for event monitor following DCCV to determine if he maintains sinus rhythm.  Discussed importance of weight loss.  Significantly elevated Epworth score noted. => Sleep study ordered.  Recent Hospitalizations:   DCCV 12/12/2020  Reviewed  CV studies:    The following studies were reviewed today: (if available, images/films reviewed: From Epic Chart or Care Everywhere) . Event monitor still pending . Myoview 01/11/2021: INTERMEDIATE RISK study consistent with ischemia.  Medium size mild severity reversible perfusion defect in the basal inferoseptal, mid inferoseptal and apical inferior location.  Unable to calculate EF and wall motion due to A. fib.  Interval History:   Michael Fuller presents here today for post DCCV f/u to discuss results of [place]. he is happy to announce that he has lost back some of pain pounds he gained from his hospitalization.  He does indicate feeling little better after his cardioversion.  He still has a little bit of fatigue and exertional dyspnea, and acknowledges that his weight is up.  He has better energy now and denies any real exertional dyspnea.  No PND or orthopnea. He says he feels different after starting medications but he is not sure whether this decrease metoprolol  dose or the Multaq.  He is still reluctant to exercise, especially since because of adequately about the Myoview.  He has not had any chest pain or pressure since being out of A. fib.  He denies any real PND orthopnea.  Trivial edema.  CV Review of Symptoms (Summary) Cardiovascular ROS: positive for - dyspnea on exertion and This is a definitely improved.  Is still present-probably because of deconditioning and weight. negative for - chest pain, edema, irregular heartbeat, orthopnea, palpitations, paroxysmal nocturnal dyspnea, rapid heart rate, shortness of breath or Notably less fatigue.  No syncope or near syncope, TIAs or amaurosis fugax.  The patient DOES NOT have symptoms concerning for COVID-19 infection (fever, chills, cough, or new shortness of breath).   REVIEWED OF SYSTEMS   Review of Systems  Constitutional: Positive for malaise/fatigue (This is improving the father when he gets on the [place].  He does notice it was improved also after cardioversion.Marland Kitchen) and weight loss (He had gained 15 pounds after his  hospitalization, but has now lost about half that back.Marland Kitchen).  HENT: Negative for congestion and nosebleeds.   Respiratory: Negative for shortness of breath (Definitely improved.).   Cardiovascular: Negative for chest pain, palpitations (He is no longer feeling the palpitations when he lays in his chest..), leg swelling and PND.  Gastrointestinal: Negative for abdominal pain, blood in stool and melena.  Genitourinary: Negative for hematuria.  Musculoskeletal: Positive for back pain and joint pain (Diffuse). Negative for myalgias.  Neurological: Negative for dizziness (Early on, but not now.), focal weakness, loss of consciousness and weakness.  Psychiatric/Behavioral: Negative for depression and memory loss. The patient has insomnia. The patient is not nervous/anxious.        See Epworth score    EPWORTH SCORE 12/27/2020 in [place]   12/27/2020  How likely are you to doze  off or fall asleep in the following situations?  Sitting and reading? Moderate chance of dozing  Watching TV? High chance of dozing  Sitting, inactive in a public place (e.g. a theatre or a meeting)? Slight chance of dozing  As a passenger in a car for an hour without a break? Moderate chance of dozing  Lying down to rest in the afternoon when circumstances permit? High chance of dozing  Sitting and talking to someone? Would never doze  Sitting quietly after a lunch without alcohol? High chance of dozing  In a car, while stopped for a few minutes in traffic?  Would never doze  Epworth Total Score 15  Document the patient's comorbid illnesses and symptoms of sleep apnea:   The patient has the following comorbid illnesses: Cardiac Arrhythmia  The patient has the following symptoms of sleep apnea: Daytime Sleepiness; Snoring; Morning Headaches; Un-refreshing Sleep; Witnessed Apnea    I have reviewed and (if needed) personally updated the patient's problem list, medications, allergies, past medical and surgical history, social and family history.   PAST MEDICAL HISTORY   Past Medical History:  Diagnosis Date  . Anxiety disorder   . Complication of anesthesia    [place]  . COVID-19 virus infection 11/08/2020   Cough and congestion for several weeks with 4 to 5 days worsening.  Marland Kitchen DJD (degenerative joint disease), lumbosacral   . Hyperlipidemia due to dietary fat intake   . Nephrolithiasis   . Osteoarthritis involving multiple joints on both sides of body   . Persistent atrial fibrillation ([place]) 10/2020   Initial diagnosis with the setting of COVID-19 infection  . RLS (restless legs syndrome)     PAST SURGICAL HISTORY   Past Surgical History:  Procedure Laterality Date  . BACK SURGERY  1996   Lumbosacral spine--two surgeries  . CARDIOVERSION N/A 01/12/2021   Procedure: SUCCESSFUL CARDIOVERSION;  Surgeon: Dorothy Spark, MD;  Location: [facility]COPY;  Service: Cardiovascular;   Laterality: N/A;  . CARPAL TUNNEL RELEASE Bilateral   . SHOULDER SURGERY Bilateral   . TRANSTHORACIC ECHOCARDIOGRAM  11/25/2020   (Post COVID & New Afib): EF 65 to 70%.  Hyperdynamic LV.  Normal diastolic pressures.  Moderate LA dilation.  Otherwise normal study.  Valves, normal RV/RA pressure.    Immunization History  Administered Date(s) Administered  . Influenza,inj,Quad PF,6+ Mos 08/28/2019    MEDICATIONS/ALLERGIES   Current Meds  Medication Sig  . dronedarone (MULTAQ) 400 MG tablet Take 1 tablet (400 mg total) by mouth 2 (two) times daily with a meal.  . metoprolol tartrate (LOPRESSOR) 50 MG tablet Take 1 tablet (50 mg total) by mouth 2 (two) times  daily.  . pramipexole (MIRAPEX) 0.5 MG tablet Take 0.5 mg by mouth daily.  . rivaroxaban (XARELTO) 20 MG TABS tablet Take 1 tablet (20 mg total) by mouth daily with supper.  . venlafaxine (EFFEXOR) 75 MG tablet Take 75 mg by mouth 2 (two) times daily.  Metoprolol and Eliquis started in December 2021  No Known Allergies  SOCIAL HISTORY/FAMILY HISTORY   Reviewed in Epic:  Pertinent findings:  Social History   Tobacco Use  . Smoking status: Never Smoker  . Smokeless tobacco: Never Used  Substance Use Topics  . Alcohol use: Yes    Comment: occ  . Drug use: Never   Social History   Social History Narrative   Married father of two sons-28 and 19.  He lives with his wife in [place].   He drinks maybe 4-5 beers a day for "pain medication"    His job as a working in shift, often working night shift.   Does not regularly exercise per se. ->  His work involves doing jobs around farm.  Unfortunately hasn't been all that active recently since his COVID-19 infection, and has been scared to be very aggressive with being on Eliquis.    OBJCTIVE -PE, EKG, labs   Wt Readings from Last 3 Encounters:  01/18/21 (!) 306 lb 12.8 oz (139.2 kg)  01/12/21 (!) 305 lb (138.3 kg)  01/10/21 (!) 311 lb (141.1 kg)    Physical Exam: BP  128/76   Pulse 60   Ht 6\' 2"  (1.88 m)   Wt (!) 306 lb 12.8 oz (139.2 kg)   SpO2 98%   BMI 39.39 kg/m  Physical Exam Constitutional:      General: He is not in acute distress.    Appearance: Normal appearance. He is obese. He is not ill-appearing or toxic-appearing.     Comments: With risk factors, BMI of 39 =morbid obesity.  HENT:     Head: Normocephalic and atraumatic.  Neck:     Vascular: No carotid bruit, hepatojugular reflux or JVD.  Cardiovascular:     Rate and Rhythm: Normal rate and regular rhythm.     Chest Wall: PMI is not displaced.     Pulses: No decreased pulses (Mostly because of body habitus).     Heart sounds: S2 normal. Heart sounds are distant. No murmur heard. No friction rub. No gallop.   Pulmonary:     Effort: Pulmonary effort is normal. No respiratory distress.     Breath sounds: Normal breath sounds.  Chest:     Chest wall: No tenderness.  Abdominal:     Comments: No HSM or bruit; truncal obesity  Musculoskeletal:        General: No swelling. Normal range of motion.     Cervical back: Normal range of motion and neck supple.  Neurological:     General: No focal deficit present.     Mental Status: He is alert and oriented to person, place, and time.     Cranial Nerves: No cranial nerve deficit.     Motor: No weakness.     Gait: Gait normal.  Psychiatric:        Mood and Affect: Mood normal.        Behavior: Behavior normal.        Thought Content: Thought content normal.        Judgment: Judgment normal.     Adult ECG Report  Rate: 60;  Rhythm: normal sinus rhythm;   Narrative Interpretation: Otherwise relatively normal.  Recent Labs: March 18, 2020: TC 11/20/2016, TG 89, HDL 44, LDL 182. No results found for: CHOL, HDL, LDLCALC, LDLDIRECT, TRIG, CHOLHDL Lab Results  Component Value Date   CREATININE 1.10 01/09/2021   BUN 10 01/09/2021   NA 141 01/09/2021   K 4.4 01/09/2021   CL 104 01/09/2021   CO2 22 01/09/2021   CBC Latest Ref Rng &  Units 01/09/2021 11/08/2020  WBC 3.4 - 10.8 x10E3/uL 7.5 5.4  Hemoglobin 13.0 - 17.7 g/dL 15.0 15.3  Hematocrit 37.5 - 51.0 % 44.0 44.8  Platelets 150 - 450 x10E3/uL 205 174    No results found for: TSH  ==================================================  COVID-19 Education: The signs and symptoms of COVID-19 were discussed with the patient and how to seek care for testing (follow up with PCP or arrange E-visit).   The importance of social distancing and COVID-19 vaccination was discussed today. The patient is practicing social distancing & Masking.   I spent a total of 38 minutes with the patient spent in direct patient consultation.  -> Detailed description of stress test results and different options moving forward. Cardiac catheterization explanation and consent obtained.  Additional time spent with chart review  / charting (studies, outside notes, etc): 20 min Total Time: 58 min   Current medicines are reviewed at length with the patient today.  (+/- concerns) N/A  This visit occurred during the SARS-CoV-2 public health emergency.  Safety protocols were in place, including screening questions prior to the visit, additional usage of staff PPE, and extensive cleaning of exam room while observing appropriate contact time as indicated for disinfecting solutions.  Notice: This dictation was prepared with Dragon dictation along with smaller phrase technology. Any transcriptional errors that result from this process are unintentional and may not be corrected upon review.  Patient Instructions / Medication Changes & Studies & Tests Ordered   Patient Instructions  Medication Instructions:  No changes   *If you need a refill on your cardiac medications before your next appointment, please call your pharmacy*   Lab Work:  CBC For Cath  BMP  For Cath You will need a COVID-19  Test 3 days prior to your procedure cardiac cath .  This is a Drive Up Visit at [address]  [place], [place] [address]. Someone will direct you to the appropriate testing line. Stay in your car and someone will be with you shortly.  If you have labs (blood work) drawn today and your tests are completely normal, you will receive your results only by: Marland Kitchen MyChart Message (if you have MyChart) OR . A paper copy in the mail If you have any lab test that is abnormal or we need to change your treatment, we will call you to review the results.   Testing/Procedures:  will be schedule for March25 ,2022 - Your physician has requested that you have a cardiac catheterization. Cardiac catheterization is used to diagnose and/or treat various heart conditions. Doctors may recommend this procedure for a number of different reasons. The most common reason is to evaluate chest pain. Chest pain can be a symptom of coronary artery disease (CAD), and cardiac catheterization can show whether plaque is narrowing or blocking your heart's arteries. This procedure is also used to evaluate the valves, as well as measure the blood flow and oxygen levels in different parts of your heart. For further information please visit HugeFiesta.tn. Please follow instruction sheet, as given.    Follow-Up: At [facility], you and your health needs  are our priority.  As part of our continuing mission to provide you with exceptional heart care, we have created designated Provider Care Teams.  These Care Teams include your primary Cardiologist (physician) and Advanced Practice Providers (APPs -  Physician Assistants and Nurse Practitioners) who all work together to provide you with the care you need, when you need it.     Your next appointment:   keep appointment in April 2022 with Afib clinic and May 2022 with Dr Katelen Luepke  The format for your next appointment:   In Person  Provider:   Glenetta Hew, MD   Other Instructions Contact you with instruction for cardiac cath  Friday March 4 or March 7,2022    Studies Ordered:    Orders Placed This Encounter  Procedures  . CBC  . Basic metabolic panel  . EKG 12-Lead     Glenetta Hew, M.D., M.S. Interventional Cardiologist   Pager # [phone] Phone # [phone] [address] [place],  [address]   Thank you for choosing Heartcare at [facility]!!

## 2021-01-18 NOTE — Assessment & Plan Note (Signed)
Elevated Epworth Scale - Polysomnogram ordered. Wgt loss recommended.

## 2021-01-18 NOTE — Assessment & Plan Note (Signed)
Thankfully, his lost\15 pounds he gained.  He understands the importance of tighter modification and exercise.  Need to exclude ischemic CAD in order to encourage more aggressive exercise.

## 2021-01-18 NOTE — Patient Instructions (Addendum)
Medication Instructions:  No changes   *If you need a refill on your cardiac medications before your next appointment, please call your pharmacy*   Lab Work:  [place] will need a COVID-19  Test 3 days prior to your procedure cardiac cath .  This is a Drive Up Visit at [address] [place], [place] [address]. Someone will direct you to the appropriate testing line. Stay in your car and someone will be with you shortly.  If you have labs (blood work) drawn today and your tests are completely normal, you will receive your results only by: Marland Kitchen MyChart Message (if you have MyChart) OR . A paper copy in the mail If you have any lab test that is abnormal or we need to change your treatment, we will call you to review the results.   Testing/Procedures:  will be schedule for March25 ,2022 - Your physician has requested that you have a cardiac catheterization. Cardiac catheterization is used to diagnose and/or treat various heart conditions. Doctors may recommend this procedure for a number of different reasons. The most common reason is to evaluate chest pain. Chest pain can be a symptom of coronary artery disease (CAD), and cardiac catheterization can show whether plaque is narrowing or blocking your heart's arteries. This procedure is also used to evaluate the valves, as well as measure the blood flow and oxygen levels in different parts of your heart. For further information please visit HugeFiesta.tn. Please follow instruction sheet, as given.    Follow-Up: At [facility], you and your health needs are our priority.  As part of our continuing mission to provide you with exceptional heart care, we have created designated Provider Care Teams.  These Care Teams include your primary Cardiologist (physician) and Advanced Practice Providers (APPs -  Physician Assistants and Nurse Practitioners) who all work together to provide you with the care you need, when you need  it.     Your next appointment:   keep appointment in April 2022 with Afib clinic and May 2022 with Dr harding  The format for your next appointment:   In Person  Provider:   Glenetta Hew, MD   Other Instructions Contact you with instruction for cardiac cath  Friday March 4 or March 7,2022

## 2021-01-18 NOTE — Assessment & Plan Note (Signed)
Blood pressure improved with increased dose of beta-blocker.  Tolerating well.  No change.  Next option will be ARB.

## 2021-01-18 NOTE — Assessment & Plan Note (Signed)
Myoview stress test ordered as part of ischemic evaluation for new onset A. fib.  He did have some chest discomfort while in A. fib, but no longer having it now.  Also has notable exertional dyspnea while in A. Fib. It is possible that the stress test is false positive given the location of the lesion, it is clearly reversible.  As such, cannot exclude an ischemic lesion. Recommendations cardiac catheterization. => Pending results of cardiac catheterization, will likely need to be more aggressive addressing lipids.  He is currently on noninterrupted Xarelto from his cardioversion on the 24th.  Will need to be uninterrupted until March 24. We will plan cardiac catheterization on March 25.  Shared Decision Making/Informed Consent{ All outpatient stress tests require an informed consent  The risks [stroke (1 in 1000), death (1 in 71), kidney failure [usually temporary] (1 in 500), bleeding (1 in 200), allergic reaction [possibly serious] (1 in 200)], benefits (diagnostic support and management of coronary artery disease) and alternatives of a cardiac catheterization (with possible PCI) were discussed in detail with Mr. Dragoo and he is willing to proceed.

## 2021-01-20 ENCOUNTER — Telehealth: Payer: Self-pay | Admitting: *Deleted

## 2021-01-30 NOTE — Telephone Encounter (Signed)
CALLED  SPOKE TO PATIENT . INSTRUCTION GIVEN FOR UPCOMING LEFT HEART CATH .  WRITTEN INSTRUCTION MAILED ON MARCH 15,2022  PATIENT VERBALIZED UNDERSTANDING.

## 2021-02-01 ENCOUNTER — Other Ambulatory Visit: Payer: Self-pay | Admitting: *Deleted

## 2021-02-01 DIAGNOSIS — I4819 Other persistent atrial fibrillation: Secondary | ICD-10-CM

## 2021-02-01 DIAGNOSIS — R9439 Abnormal result of other cardiovascular function study: Secondary | ICD-10-CM

## 2021-02-01 NOTE — Progress Notes (Signed)
Orders placed for left heat cath for 02/10/21

## 2021-02-06 ENCOUNTER — Other Ambulatory Visit: Payer: Self-pay

## 2021-02-06 DIAGNOSIS — I4819 Other persistent atrial fibrillation: Secondary | ICD-10-CM | POA: Diagnosis not present

## 2021-02-06 DIAGNOSIS — R9439 Abnormal result of other cardiovascular function study: Secondary | ICD-10-CM

## 2021-02-06 DIAGNOSIS — R0600 Dyspnea, unspecified: Secondary | ICD-10-CM

## 2021-02-06 DIAGNOSIS — R0609 Other forms of dyspnea: Secondary | ICD-10-CM

## 2021-02-06 LAB — CBC
Hematocrit: 44.4 % (ref 37.5–51.0)
Hemoglobin: 15 g/dL (ref 13.0–17.7)
MCH: 29.8 pg (ref 26.6–33.0)
MCHC: 33.8 g/dL (ref 31.5–35.7)
MCV: 88 fL (ref 79–97)
Platelets: 181 10*3/uL (ref 150–450)
RBC: 5.04 x10E6/uL (ref 4.14–5.80)
RDW: 13.4 % (ref 11.6–15.4)
WBC: 8.6 10*3/uL (ref 3.4–10.8)

## 2021-02-07 ENCOUNTER — Other Ambulatory Visit (HOSPITAL_COMMUNITY): Payer: 59

## 2021-02-07 LAB — BASIC METABOLIC PANEL
BUN/Creatinine Ratio: 11 (ref 9–20)
BUN: 11 mg/dL (ref 6–24)
CO2: 22 mmol/L (ref 20–29)
Calcium: 9.4 mg/dL (ref 8.7–10.2)
Chloride: 105 mmol/L (ref 96–106)
Creatinine, Ser: 1.02 mg/dL (ref 0.76–1.27)
Glucose: 115 mg/dL — ABNORMAL HIGH (ref 65–99)
Potassium: 4.7 mmol/L (ref 3.5–5.2)
Sodium: 143 mmol/L (ref 134–144)
eGFR: 86 mL/min/{1.73_m2} (ref >59)

## 2021-02-08 ENCOUNTER — Other Ambulatory Visit (HOSPITAL_COMMUNITY)
Admission: RE | Admit: 2021-02-08 | Discharge: 2021-02-08 | Disposition: A | Discharge status: Home / Self Care | Payer: 59 | Source: Ambulatory Visit | Attending: Cardiology | Admitting: Cardiology

## 2021-02-08 DIAGNOSIS — Z20822 Contact with and (suspected) exposure to covid-19: Secondary | ICD-10-CM | POA: Insufficient documentation

## 2021-02-08 DIAGNOSIS — Z01812 Encounter for preprocedural laboratory examination: Secondary | ICD-10-CM | POA: Insufficient documentation

## 2021-02-08 LAB — SARS CORONAVIRUS 2 (TAT 6-24 HRS): SARS Coronavirus 2: NEGATIVE

## 2021-02-09 ENCOUNTER — Telehealth: Payer: Self-pay | Admitting: *Deleted

## 2021-02-09 NOTE — Telephone Encounter (Addendum)
Pt contacted pre-catheterization scheduled at [facility] for: Friday February 10, 2021 7:30 AM Verified arrival time and place: [facility]) at: 5:30 AM   No solid food after midnight prior to cath, clear liquids until 5 AM day of procedure.  Hold: Xarelto-none 02/09/21 until post procedure   Except hold medications AM meds can be  taken pre-cath with sips of water including: ASA 81 mg   Confirmed patient has responsible adult to drive home post procedure and be with patient first 24 hours after arriving home: yes  You are allowed ONE visitor in the waiting room during the time you are at the hospital for your procedure. Both you and your visitor must wear a mask once you enter the hospital.  Reviewed procedure/mask/visitor instructions with patient.

## 2021-02-10 ENCOUNTER — Other Ambulatory Visit: Payer: Self-pay

## 2021-02-10 ENCOUNTER — Ambulatory Visit (HOSPITAL_COMMUNITY)
Admission: RE | Admit: 2021-02-10 | Discharge: 2021-02-10 | Disposition: A | Discharge status: Home / Self Care | Payer: 59 | Attending: Cardiology | Admitting: Cardiology

## 2021-02-10 ENCOUNTER — Encounter (HOSPITAL_COMMUNITY)
Admission: RE | Disposition: A | Discharge status: Home / Self Care | Payer: Self-pay | Source: Home / Self Care | Attending: Cardiology

## 2021-02-10 ENCOUNTER — Encounter (HOSPITAL_COMMUNITY): Payer: Self-pay | Admitting: Cardiology

## 2021-02-10 DIAGNOSIS — I2510 Atherosclerotic heart disease of native coronary artery without angina pectoris: Secondary | ICD-10-CM | POA: Insufficient documentation

## 2021-02-10 DIAGNOSIS — R0600 Dyspnea, unspecified: Secondary | ICD-10-CM | POA: Diagnosis present

## 2021-02-10 DIAGNOSIS — I4819 Other persistent atrial fibrillation: Secondary | ICD-10-CM | POA: Diagnosis not present

## 2021-02-10 DIAGNOSIS — R0609 Other forms of dyspnea: Secondary | ICD-10-CM | POA: Diagnosis not present

## 2021-02-10 DIAGNOSIS — Z79899 Other long term (current) drug therapy: Secondary | ICD-10-CM | POA: Insufficient documentation

## 2021-02-10 DIAGNOSIS — R9439 Abnormal result of other cardiovascular function study: Secondary | ICD-10-CM | POA: Diagnosis not present

## 2021-02-10 DIAGNOSIS — R0683 Snoring: Secondary | ICD-10-CM | POA: Diagnosis not present

## 2021-02-10 DIAGNOSIS — Z6839 Body mass index (BMI) 39.0-39.9, adult: Secondary | ICD-10-CM | POA: Insufficient documentation

## 2021-02-10 DIAGNOSIS — Z8616 Personal history of COVID-19: Secondary | ICD-10-CM | POA: Diagnosis not present

## 2021-02-10 DIAGNOSIS — E785 Hyperlipidemia, unspecified: Secondary | ICD-10-CM | POA: Insufficient documentation

## 2021-02-10 DIAGNOSIS — Z7901 Long term (current) use of anticoagulants: Secondary | ICD-10-CM | POA: Insufficient documentation

## 2021-02-10 DIAGNOSIS — I10 Essential (primary) hypertension: Secondary | ICD-10-CM | POA: Diagnosis not present

## 2021-02-10 HISTORY — PX: LEFT HEART CATH AND CORONARY ANGIOGRAPHY: CATH118249

## 2021-02-10 SURGERY — LEFT HEART CATH AND CORONARY ANGIOGRAPHY
Anesthesia: LOCAL

## 2021-02-10 MED ORDER — HEPARIN SODIUM (PORCINE) 1000 UNIT/ML IJ SOLN
INTRAMUSCULAR | Status: AC
  Filled 2021-02-10: qty 1

## 2021-02-10 MED ORDER — SODIUM CHLORIDE 0.9% FLUSH: 3.0000 mL | INTRAVENOUS | Status: DC | PRN

## 2021-02-10 MED ORDER — ONDANSETRON HCL 4 MG/2ML IJ SOLN: 4.0000 mg | Freq: Four times a day (QID) | INTRAMUSCULAR | Status: DC | PRN

## 2021-02-10 MED ORDER — HEPARIN SODIUM (PORCINE) 1000 UNIT/ML IJ SOLN
INTRAMUSCULAR | Status: DC | PRN
  Administered 2021-02-10: 6000 [IU] via INTRAVENOUS

## 2021-02-10 MED ORDER — SODIUM CHLORIDE 0.9% FLUSH: 3.0000 mL | Freq: Two times a day (BID) | INTRAVENOUS | Status: DC

## 2021-02-10 MED ORDER — LIDOCAINE HCL (PF) 1 % IJ SOLN
INTRAMUSCULAR | Status: AC
  Filled 2021-02-10: qty 30

## 2021-02-10 MED ORDER — FUROSEMIDE 10 MG/ML IJ SOLN
INTRAMUSCULAR | Status: DC | PRN
  Administered 2021-02-10: 20 mg via INTRAVENOUS

## 2021-02-10 MED ORDER — HEPARIN (PORCINE) IN NACL 1000-0.9 UT/500ML-% IV SOLN
INTRAVENOUS | Status: AC
  Filled 2021-02-10: qty 1500

## 2021-02-10 MED ORDER — VERAPAMIL HCL 2.5 MG/ML IV SOLN
INTRAVENOUS | Status: DC | PRN
  Administered 2021-02-10: 10 mL via INTRA_ARTERIAL

## 2021-02-10 MED ORDER — HEPARIN (PORCINE) IN NACL 1000-0.9 UT/500ML-% IV SOLN
INTRAVENOUS | Status: DC | PRN
  Administered 2021-02-10 (×2): 500 mL

## 2021-02-10 MED ORDER — MIDAZOLAM HCL 2 MG/2ML IJ SOLN
INTRAMUSCULAR | Status: DC | PRN
  Administered 2021-02-10: 1 mg via INTRAVENOUS

## 2021-02-10 MED ORDER — FUROSEMIDE 10 MG/ML IJ SOLN
INTRAMUSCULAR | Status: AC
  Filled 2021-02-10: qty 4

## 2021-02-10 MED ORDER — MIDAZOLAM HCL 2 MG/2ML IJ SOLN
INTRAMUSCULAR | Status: AC
  Filled 2021-02-10: qty 2

## 2021-02-10 MED ORDER — VERAPAMIL HCL 2.5 MG/ML IV SOLN
INTRAVENOUS | Status: AC
  Filled 2021-02-10: qty 2

## 2021-02-10 MED ORDER — NITROGLYCERIN 1 MG/10 ML FOR IR/CATH LAB
INTRA_ARTERIAL | Status: AC
  Filled 2021-02-10: qty 10

## 2021-02-10 MED ORDER — FENTANYL CITRATE (PF) 100 MCG/2ML IJ SOLN
INTRAMUSCULAR | Status: AC
  Filled 2021-02-10: qty 2

## 2021-02-10 MED ORDER — SODIUM CHLORIDE 0.9 % IV SOLN: INTRAVENOUS | Status: DC

## 2021-02-10 MED ORDER — LIDOCAINE HCL (PF) 1 % IJ SOLN
INTRAMUSCULAR | Status: DC | PRN
  Administered 2021-02-10: 2 mL

## 2021-02-10 MED ORDER — FENTANYL CITRATE (PF) 100 MCG/2ML IJ SOLN
INTRAMUSCULAR | Status: DC | PRN
  Administered 2021-02-10: 25 ug via INTRAVENOUS

## 2021-02-10 MED ORDER — HYDRALAZINE HCL 20 MG/ML IJ SOLN: 10.0000 mg | INTRAMUSCULAR | Status: DC | PRN

## 2021-02-10 MED ORDER — ACETAMINOPHEN 325 MG PO TABS: 650.0000 mg | ORAL_TABLET | ORAL | Status: DC | PRN

## 2021-02-10 MED ORDER — SODIUM CHLORIDE 0.9 % WEIGHT BASED INFUSION: 1.0000 mL/kg/h | INTRAVENOUS | Status: DC

## 2021-02-10 MED ORDER — ASPIRIN 81 MG PO CHEW: 81.0000 mg | CHEWABLE_TABLET | Freq: Once | ORAL | Status: DC

## 2021-02-10 MED ORDER — SODIUM CHLORIDE 0.9 % WEIGHT BASED INFUSION
3.0000 mL/kg/h | INTRAVENOUS | Status: AC
  Administered 2021-02-10: 3 mL/kg/h via INTRAVENOUS

## 2021-02-10 MED ORDER — SODIUM CHLORIDE 0.9 % IV SOLN: 250.0000 mL | INTRAVENOUS | Status: DC | PRN

## 2021-02-10 MED ORDER — IOHEXOL 350 MG/ML SOLN
INTRAVENOUS | Status: DC | PRN
  Administered 2021-02-10: 100 mL

## 2021-02-10 MED ORDER — LABETALOL HCL 5 MG/ML IV SOLN: 10.0000 mg | INTRAVENOUS | Status: DC | PRN

## 2021-02-10 MED ORDER — NITROGLYCERIN 1 MG/10 ML FOR IR/CATH LAB
INTRA_ARTERIAL | Status: DC | PRN
  Administered 2021-02-10: 200 ug via INTRACORONARY

## 2021-02-10 SURGICAL SUPPLY — 11 items
CATH INFINITI JR4 5F (CATHETERS) ×2 IMPLANT
CATH OPTITORQUE TIG 4.0 5F (CATHETERS) ×2 IMPLANT
DEVICE RAD COMP TR BAND LRG (VASCULAR PRODUCTS) ×2 IMPLANT
GLIDESHEATH SLEND SS 6F .021 (SHEATH) ×2 IMPLANT
GUIDEWIRE INQWIRE 1.5J.035X260 (WIRE) ×1 IMPLANT
INQWIRE 1.5J .035X260CM (WIRE) ×2
KIT HEART LEFT (KITS) ×2 IMPLANT
PACK CARDIAC CATHETERIZATION (CUSTOM PROCEDURE TRAY) ×2 IMPLANT
SHEATH PROBE COVER 6X72 (BAG) ×2 IMPLANT
TRANSDUCER W/STOPCOCK (MISCELLANEOUS) ×2 IMPLANT
TUBING CIL FLEX 10 FLL-RA (TUBING) ×2 IMPLANT

## 2021-02-10 NOTE — Discharge Instructions (Signed)
Radial Site Care  This sheet gives you information about how to care for yourself after your procedure. Your health care provider may also give you more specific instructions. If you have problems or questions, contact your health care provider. What can I expect after the procedure? After the procedure, it is common to have:  Bruising and tenderness at the catheter insertion area. Follow these instructions at home: Medicines  Take over-the-counter and prescription medicines only as told by your health care provider. Insertion site care  Follow instructions from your health care provider about how to take care of your insertion site. Make sure you: ? Wash your hands with soap and water before you change your bandage (dressing). If soap and water are not available, use hand sanitizer. ? Change your dressing as told by your health care provider. ? Leave stitches (sutures), skin glue, or adhesive strips in place. These skin closures may need to stay in place for 2 weeks or longer. If adhesive strip edges start to loosen and curl up, you may trim the loose edges. Do not remove adhesive strips completely unless your health care provider tells you to do that.  Check your insertion site every day for signs of infection. Check for: ? Redness, swelling, or pain. ? Fluid or blood. ? Pus or a bad smell. ? Warmth.  Do not take baths, swim, or use a hot tub until your health care provider approves.  You may shower 24-48 hours after the procedure, or as directed by your health care provider. ? Remove the dressing and gently wash the site with plain soap and water. ? Pat the area dry with a clean towel. ? Do not rub the site. That could cause bleeding.  Do not apply powder or lotion to the site. Activity  For 24 hours after the procedure, or as directed by your health care provider: ? Do not flex or bend the affected arm. ? Do not push or pull heavy objects with the affected arm. ? Do not drive  yourself home from the hospital or clinic. You may drive 24 hours after the procedure unless your health care provider tells you not to. ? Do not operate machinery or power tools.  Do not lift anything that is heavier than 10 lb (4.5 kg), or the limit that you are told, until your health care provider says that it is safe.  Ask your health care provider when it is okay to: ? Return to work or school. ? Resume usual physical activities or sports. ? Resume sexual activity.   General instructions  If the catheter site starts to bleed, raise your arm and put firm pressure on the site. If the bleeding does not stop, get help right away. This is a medical emergency.  If you went home on the same day as your procedure, a responsible adult should be with you for the first 24 hours after you arrive home.  Keep all follow-up visits as told by your health care provider. This is important. Contact a health care provider if:  You have a fever.  You have redness, swelling, or yellow drainage around your insertion site. Get help right away if:  You have unusual pain at the radial site.  The catheter insertion area swells very fast.  The insertion area is bleeding, and the bleeding does not stop when you hold steady pressure on the area.  Your arm or hand becomes pale, cool, tingly, or numb. These symptoms may represent a serious   problem that is an emergency. Do not wait to see if the symptoms will go away. Get medical help right away. Call your local emergency services (911 in the [place] Do not drive yourself to the hospital. Summary  After the procedure, it is common to have bruising and tenderness at the site.  Follow instructions from your health care provider about how to take care of your radial site wound. Check the wound every day for signs of infection.  Do not lift anything that is heavier than 10 lb (4.5 kg), or the limit that you are told, until your health care provider says that it  is safe. This information is not intended to replace advice given to you by your health care provider. Make sure you discuss any questions you have with your health care provider. Document Revised: 12/11/2017 Document Reviewed: 12/11/2017 Elsevier Patient Education  2021 Elsevier Inc.  

## 2021-02-10 NOTE — Interval H&P Note (Signed)
History and Physical Interval Note:  02/10/2021 7:54 AM  Michael Fuller  has presented today for surgery, with the diagnosis of abnoraml mioview.  The various methods of treatment have been discussed with the patient and family. After consideration of risks, benefits and other options for treatment, the patient has consented to  Procedure(s): LEFT HEART CATH AND CORONARY ANGIOGRAPHY (N/A) [place]   as a surgical intervention.  The patient's history has been reviewed, patient examined, no change in status, stable for surgery.  I have reviewed the patient's chart and labs.  Questions were answered to the patient's satisfaction.     Cath Lab Visit (complete for each Cath Lab visit)  Clinical Evaluation Leading to the Procedure:   ACS: No.  Non-ACS:    Anginal Classification: CCS I  Anti-ischemic medical therapy: Minimal Therapy (1 class of medications)  Non-Invasive Test Results: High-risk stress test findings: cardiac mortality >3%/year  Prior CABG: No previous CABG     Glenetta Hew

## 2021-02-14 ENCOUNTER — Telehealth: Payer: Self-pay | Admitting: Cardiology

## 2021-02-14 NOTE — Telephone Encounter (Signed)
   *  STAT* If patient is at the pharmacy, call can be transferred to refill team.   1. Which medications need to be refilled? (please list name of each medication and dose if known)   metoprolol tartrate (LOPRESSOR) 50 MG tablet   2. Which pharmacy/location (including street and city if local pharmacy) is medication to be sent to? [place], [place] [address]  3. Do they need a 30 day or 90 day supply? 90 days  Pt ran out since Dr. Ellyn Hack increased his meds to 2 times a day

## 2021-02-15 MED ORDER — METOPROLOL TARTRATE 50 MG PO TABS
50.0000 mg | ORAL_TABLET | Freq: Two times a day (BID) | ORAL | 3 refills | Status: DC
Start: 2021-02-15 — End: 2021-03-30

## 2021-02-15 NOTE — Addendum Note (Signed)
Addended by: Zebedee Iba on: 02/15/2021 04:03 PM   Modules accepted: Orders

## 2021-02-22 ENCOUNTER — Encounter: Payer: Self-pay | Admitting: *Deleted

## 2021-02-22 ENCOUNTER — Other Ambulatory Visit: Payer: Self-pay | Admitting: *Deleted

## 2021-02-22 NOTE — Patient Outreach (Signed)
[place] [facility]) Care Management  02/22/2021  Michael Fuller August 21, 1961 [id number]  Referral Received 3/24 Initial Outreach 4/6  Telephone Screen/Assessment  RN attempted outreach today however unable to leave a HIPAA voice message to request a call back.   Will send an outreach letter and scheduled another follow up call over the next week for pending services.  [name], RN Care Management Coordinator [place] Office [phone]

## 2021-02-28 ENCOUNTER — Other Ambulatory Visit: Payer: Self-pay | Admitting: *Deleted

## 2021-02-28 NOTE — Patient Outreach (Signed)
[place] [facility]) Care Management  02/28/2021  Michael Fuller Jan 16, 1961 [id number]   Telephone Assessment/Declined  Outreach #2  RN spoke with pt today concerning [facility] services and the purpose for today's call. Discussed pt's medical history and offered an extension of [facility] services to further assist pt with managing his ongoing medical issues. Pt states his Atrial Fibrillation has resolved with no additional issues with his ongoing medication. HTN no issues as pt very active in managing his health. Continue to offer assistance via maintenance with possible health coach if not with community however pt declined both again with no immediate needs. Offered to sent [facility] packet for information if needed in the future (receptive).  No needs and pt has declined [facility] services at this time for health coach or any other disclipine at this time. Will send [facility] information packet. Will notify the provider and close this case.  [name], RN Care Management Coordinator [place] Office [phone]

## 2021-03-06 ENCOUNTER — Telehealth: Payer: Self-pay

## 2021-03-06 NOTE — Telephone Encounter (Addendum)
Called patient and could not leave a voice message due to voicemail box is full. Will try calling patient again.   ----- Message from Leonie Man, MD sent at 03/03/2021  1:44 AM EDT ----- Monitor results results: Good news Narrative & Impression   Predominant rhythm is sinus rhythm: Minimum heart rate 50 bpm. Maximum heart rate 111 bpm. Average heart rate 64 bpm.  Only notable event was a 6 beat run of nonsustained ventricular tachycardia-  Very rare PVCs (premature ventricular contractions).  Rare SVE/PAC (premature atrial contractions)  No sustained arrhythmias: Atrial fibrillation, atrial flutter, SVT, PAT or sustained VT.  No significant pauses or bradycardia.   Overall pretty normal study.  We did not see any A. Fib.  Glenetta Hew, MD

## 2021-03-13 ENCOUNTER — Encounter: Payer: Self-pay | Admitting: *Deleted

## 2021-03-14 ENCOUNTER — Telehealth: Payer: Self-pay | Admitting: *Deleted

## 2021-03-14 ENCOUNTER — Ambulatory Visit (HOSPITAL_COMMUNITY): Payer: 59 | Admitting: Physician Assistant

## 2021-03-14 NOTE — Telephone Encounter (Signed)
Patient notified of June 21st sleep study appointment. 

## 2021-03-21 DIAGNOSIS — Z79899 Other long term (current) drug therapy: Secondary | ICD-10-CM | POA: Diagnosis not present

## 2021-03-21 DIAGNOSIS — E785 Hyperlipidemia, unspecified: Secondary | ICD-10-CM | POA: Diagnosis not present

## 2021-03-21 DIAGNOSIS — R945 Abnormal results of liver function studies: Secondary | ICD-10-CM | POA: Diagnosis not present

## 2021-03-21 DIAGNOSIS — R7303 Prediabetes: Secondary | ICD-10-CM | POA: Diagnosis not present

## 2021-03-21 DIAGNOSIS — K219 Gastro-esophageal reflux disease without esophagitis: Secondary | ICD-10-CM | POA: Diagnosis not present

## 2021-03-21 DIAGNOSIS — R69 Illness, unspecified: Secondary | ICD-10-CM | POA: Diagnosis not present

## 2021-03-21 DIAGNOSIS — I10 Essential (primary) hypertension: Secondary | ICD-10-CM | POA: Diagnosis not present

## 2021-03-21 DIAGNOSIS — G2581 Restless legs syndrome: Secondary | ICD-10-CM | POA: Diagnosis not present

## 2021-03-21 DIAGNOSIS — Z125 Encounter for screening for malignant neoplasm of prostate: Secondary | ICD-10-CM | POA: Diagnosis not present

## 2021-03-28 ENCOUNTER — Encounter (HOSPITAL_COMMUNITY): Payer: Self-pay

## 2021-03-28 ENCOUNTER — Other Ambulatory Visit: Payer: Self-pay

## 2021-03-28 ENCOUNTER — Ambulatory Visit (HOSPITAL_COMMUNITY)
Admission: RE | Admit: 2021-03-28 | Discharge: 2021-03-28 | Disposition: A | Discharge status: Home / Self Care | Payer: 59 | Source: Ambulatory Visit | Attending: Physician Assistant | Admitting: Physician Assistant

## 2021-03-28 ENCOUNTER — Ambulatory Visit (HOSPITAL_COMMUNITY): Payer: 59 | Admitting: Physician Assistant

## 2021-03-28 ENCOUNTER — Encounter (HOSPITAL_COMMUNITY): Payer: Self-pay | Admitting: Physician Assistant

## 2021-03-28 VITALS — BP 132/88 | HR 58 | Ht 74.0 in | Wt 314.4 lb

## 2021-03-28 DIAGNOSIS — Z8616 Personal history of COVID-19: Secondary | ICD-10-CM | POA: Insufficient documentation

## 2021-03-28 DIAGNOSIS — E785 Hyperlipidemia, unspecified: Secondary | ICD-10-CM | POA: Diagnosis not present

## 2021-03-28 DIAGNOSIS — I4819 Other persistent atrial fibrillation: Secondary | ICD-10-CM | POA: Insufficient documentation

## 2021-03-28 DIAGNOSIS — R0683 Snoring: Secondary | ICD-10-CM | POA: Insufficient documentation

## 2021-03-28 DIAGNOSIS — Z7901 Long term (current) use of anticoagulants: Secondary | ICD-10-CM | POA: Insufficient documentation

## 2021-03-28 DIAGNOSIS — I2510 Atherosclerotic heart disease of native coronary artery without angina pectoris: Secondary | ICD-10-CM | POA: Diagnosis not present

## 2021-03-28 DIAGNOSIS — E669 Obesity, unspecified: Secondary | ICD-10-CM | POA: Insufficient documentation

## 2021-03-28 DIAGNOSIS — Z79899 Other long term (current) drug therapy: Secondary | ICD-10-CM | POA: Insufficient documentation

## 2021-03-28 DIAGNOSIS — I10 Essential (primary) hypertension: Secondary | ICD-10-CM | POA: Diagnosis not present

## 2021-03-28 DIAGNOSIS — G2581 Restless legs syndrome: Secondary | ICD-10-CM | POA: Diagnosis not present

## 2021-03-28 DIAGNOSIS — Z6841 Body Mass Index (BMI) 40.0 and over, adult: Secondary | ICD-10-CM | POA: Diagnosis not present

## 2021-03-28 NOTE — Progress Notes (Signed)
Primary Care Physician: Asencion Noble, MD Primary Cardiologist: Dr Ellyn Hack  Primary Electrophysiologist: none Referring Physician: Dr Ferdie Ping is a 60 y.o. male with a history of HTN, HLD, atrial fibrillation who presents for consultation in the [facility].  The patient was initially diagnosed with atrial fibrillation 10/2020 after presenting to the ED with symptoms of SOB. ECG showed afib with RVR. Of note, he also tested positive for COVID at that time. Patient is on Xarelto for a CHADS2VASC score of 1. He was started on Multaq and underwent DCCV on 01/12/21. He had an abnormal stress test but LHC showed minimal coronary disease. He denies any heart racing or palpitations. He denies any bleeding issues on anticoagulation. He does note increased fatigue since increasing metoprolol.   Today, he denies symptoms of palpitations, chest pain, shortness of breath, orthopnea, PND, lower extremity edema, dizziness, presyncope, syncope, bleeding, or neurologic sequela. The patient is tolerating medications without difficulties and is otherwise without complaint today.    Atrial Fibrillation Risk Factors:  he does have symptoms or diagnosis of sleep apnea. he does not have a history of rheumatic fever. he does have a history of alcohol use.   he has a BMI of Body mass index is 40.37 kg/m.Marland Kitchen Filed Weights   03/28/21 1521  Weight: (!) 142.6 kg    Family History  Problem Relation Age of Onset  . Hypothyroidism Mother   . Hypertension Father   . Healthy Sister   . Healthy Brother   . Hypertension Maternal Grandmother   . Diabetes Mellitus II Paternal Grandfather      Atrial Fibrillation Management history:  Previous antiarrhythmic drugs: Multaq Previous cardioversions: 01/12/21 Previous ablations: none CHADS2VASC score: 1 Anticoagulation history: Xarelto    Past Medical History:  Diagnosis Date  . Anxiety disorder   . Complication of  anesthesia    [place]  . COVID-19 virus infection 11/08/2020   Cough and congestion for several weeks with 4 to 5 days worsening.  Marland Kitchen DJD (degenerative joint disease), lumbosacral   . Hyperlipidemia due to dietary fat intake   . Nephrolithiasis   . Osteoarthritis involving multiple joints on both sides of body   . Persistent atrial fibrillation ([place]) 10/2020   Initial diagnosis with the setting of COVID-19 infection  . RLS (restless legs syndrome)    Past Surgical History:  Procedure Laterality Date  . BACK SURGERY  1996   Lumbosacral spine--two surgeries  . CARDIOVERSION N/A 01/12/2021   Procedure: SUCCESSFUL CARDIOVERSION;  Surgeon: Dorothy Spark, MD;  Location: [facility] ENDOSCOPY;  Service: Cardiovascular;  Laterality: N/A;  . CARPAL TUNNEL RELEASE Bilateral   . LEFT HEART CATH AND CORONARY ANGIOGRAPHY N/A 02/10/2021   Procedure: LEFT HEART CATH AND CORONARY ANGIOGRAPHY;  Surgeon: Leonie Man, MD;  Location: [place] CV LAB;  Service: Cardiovascular;  Laterality: N/A;  . SHOULDER SURGERY Bilateral   . TRANSTHORACIC ECHOCARDIOGRAM  11/25/2020   (Post COVID & New Afib): EF 65 to 70%.  Hyperdynamic LV.  Normal diastolic pressures.  Moderate LA dilation.  Otherwise normal study.  Valves, normal RV/RA pressure.    Current Outpatient Medications  Medication Sig Dispense Refill  . dronedarone (MULTAQ) 400 MG tablet Take 1 tablet (400 mg total) by mouth 2 (two) times daily with a meal. 60 tablet 6  . metoprolol tartrate (LOPRESSOR) 50 MG tablet Take 1 tablet (50 mg total) by mouth 2 (two) times daily. 180 tablet 3  .  pramipexole (MIRAPEX) 0.5 MG tablet Take 0.5 mg by mouth daily.    . rivaroxaban (XARELTO) 20 MG TABS tablet Take 20 mg by mouth daily with supper.    . venlafaxine (EFFEXOR) 75 MG tablet Take 75 mg by mouth 2 (two) times daily.     No current facility-administered medications for this encounter.    No Known Allergies  Social History   Socioeconomic History  .  Marital status: Married    Spouse name: Vinnie Level  . Number of children: 2  . Years of education: Not on file  . Highest education level: Bachelor's degree (e.g., BA, AB, BS)  Occupational History  . Occupation: Leisure centre manager: [employer]  Tobacco Use  . Smoking status: Never Smoker  . Smokeless tobacco: Never Used  Substance and Sexual Activity  . Alcohol use: Yes    Alcohol/week: 4.0 standard drinks    Types: 4 Cans of beer per week  . Drug use: Never  . Sexual activity: Yes    Partners: Female  Other Topics Concern  . Not on file  Social History Narrative   Married father of two sons-28 and 35.  He lives with his wife in [place].   He drinks maybe 4-5 beers a day for "pain medication"    His job as a working in shift, often working night shift.   Does not regularly exercise per se. ->  His work involves doing jobs around farm.  Unfortunately hasn't been all that active recently since his COVID-19 infection, and has been scared to be very aggressive with being on Eliquis.   Social Determinants of Health   Financial Resource Strain: Not on file  Food Insecurity: Not on file  Transportation Needs: Not on file  Physical Activity: Not on file  Stress: Not on file  Social Connections: Not on file  Intimate Partner Violence: Not on file     ROS- All systems are reviewed and negative except as per the HPI above.  Physical Exam: Vitals:   03/28/21 1521  BP: 132/88  Pulse: (!) 58  Weight: (!) 142.6 kg  Height: 6\' 2"  (1.88 m)    GEN- The patient is a well appearing obese male, alert and oriented x 3 today.   Head- normocephalic, atraumatic Eyes-  Sclera clear, conjunctiva pink Ears- hearing intact Oropharynx- clear Neck- supple  Lungs- Clear to ausculation bilaterally, normal work of breathing Heart- Regular rate and rhythm, no murmurs, rubs or gallops  GI- soft, NT, ND, + BS Extremities- no clubbing, cyanosis, or edema MS- no significant  deformity or atrophy Skin- no rash or lesion Psych- euthymic mood, full affect Neuro- strength and sensation are intact  Wt Readings from Last 3 Encounters:  03/28/21 (!) 142.6 kg  02/10/21 131.5 kg  01/18/21 (!) 139.2 kg    EKG today demonstrates  SB Vent. rate 58 BPM PR interval 158 ms QRS duration 82 ms QT/QTcB 442/433 ms  Echo 11/25/20 demonstrated  1. Left ventricular ejection fraction, by estimation, is 65 to 70%. The  left ventricle has hyperdynamic function. The left ventricle has no  regional wall motion abnormalities. There is mild left ventricular  hypertrophy. Left ventricular diastolic  parameters are indeterminate.  2. Right ventricular systolic function is normal. The right ventricular  size is normal.  3. Left atrial size was moderately dilated.  4. The mitral valve is normal in structure. No evidence of mitral valve  regurgitation. No evidence of mitral stenosis.  5.  The aortic valve has an indeterminant number of cusps. Aortic valve  regurgitation is not visualized. No aortic stenosis is present.  6. The inferior vena cava is normal in size with greater than 50%  respiratory variability, suggesting right atrial pressure of 3 mmHg.  Epic records are reviewed at length today  Labs from PCP 03/21/21 Cr 1.1, K+ 4.5, GFR 78, AST 42, ALT 82 WBC 6.9, Hgb 14.8, Hct 44.6, PLT 172  CHA2DS2-VASc Score = 1  The patient's score is based upon: CHF History: No HTN History: Yes Diabetes History: No Stroke History: No Vascular Disease History: No Age Score: 0 Gender Score: 0      ASSESSMENT AND PLAN: 1. Persistent Atrial Fibrillation (ICD10:  I48.19) The patient's CHA2DS2-VASc score is 1, indicating a 0.6% annual risk of stroke.   Patient appears to be maintaining SR. Continue Multaq 400 mg BID Continue Xarelto 20 mg daily Continue Lopressor 50 mg BID. Could consider decreasing given fatigue if he is maintaining SR. His LFTs are chronically elevated, will  need to follow on Multaq.   2. Obesity Body mass index is 40.37 kg/m. Lifestyle modification was discussed at length including regular exercise and weight reduction.  3. Snoring/suspected obstructive sleep apnea The importance of adequate treatment of sleep apnea was discussed today in order to improve our ability to maintain sinus rhythm long term. Sleep study pending per Dr Ellyn Hack.  4. HTN Stable, no changes today.   Follow up with Dr Ellyn Hack as scheduled. AF clinic in 6 months.    [facility] [address] [place], [place] [address] [phone] 03/28/2021 3:31 PM

## 2021-03-30 ENCOUNTER — Ambulatory Visit (INDEPENDENT_AMBULATORY_CARE_PROVIDER_SITE_OTHER): Payer: 59 | Admitting: Cardiology

## 2021-03-30 ENCOUNTER — Other Ambulatory Visit: Payer: Self-pay

## 2021-03-30 ENCOUNTER — Encounter: Payer: Self-pay | Admitting: Cardiology

## 2021-03-30 VITALS — BP 138/90 | HR 61 | Ht 74.0 in | Wt 317.0 lb

## 2021-03-30 DIAGNOSIS — R9439 Abnormal result of other cardiovascular function study: Secondary | ICD-10-CM | POA: Diagnosis not present

## 2021-03-30 DIAGNOSIS — R0609 Other forms of dyspnea: Secondary | ICD-10-CM

## 2021-03-30 DIAGNOSIS — I10 Essential (primary) hypertension: Secondary | ICD-10-CM

## 2021-03-30 DIAGNOSIS — E7849 Other hyperlipidemia: Secondary | ICD-10-CM | POA: Diagnosis not present

## 2021-03-30 DIAGNOSIS — R0683 Snoring: Secondary | ICD-10-CM | POA: Diagnosis not present

## 2021-03-30 DIAGNOSIS — I4819 Other persistent atrial fibrillation: Secondary | ICD-10-CM | POA: Diagnosis not present

## 2021-03-30 DIAGNOSIS — R0600 Dyspnea, unspecified: Secondary | ICD-10-CM

## 2021-03-30 MED ORDER — METOPROLOL TARTRATE 50 MG PO TABS: ORAL_TABLET | ORAL | 3 refills | Status: DC

## 2021-03-30 MED ORDER — VALSARTAN 40 MG PO TABS: 40.0000 mg | ORAL_TABLET | Freq: Every morning | ORAL | 2 refills | Status: DC

## 2021-03-30 NOTE — Progress Notes (Signed)
Primary Care Provider: Asencion Noble, MD Cardiologist: Glenetta Hew, MD Electrophysiologist: None  Clinic Note: Chief Complaint  Patient presents with  . Follow-up    66-month  . Atrial Fibrillation    On Multaq/metoprolol and Xarelto.   ===================================  ASSESSMENT/PLAN   Problem List Items Addressed This Visit    Loud snoring   Persistent atrial fibrillation (HCC)-new onset - Primary (Chronic)    Cardioverted on February 24.  Currently on Xarelto for anticoagulation.  He is on Multaq, and presumably maintaining sinus rhythm.  No breakthrough spells as far symptoms go.  He is on metoprolol 50 twice daily, but with him having some fatigue, we will reduce his morning dose of metoprolol 25 mg.  He is due to be seen in A. fib clinic soon.      Relevant Medications   rosuvastatin (CRESTOR) 20 MG tablet   valsartan (DIOVAN) 40 MG tablet   metoprolol tartrate (LOPRESSOR) 50 MG tablet   Other Relevant Orders   EKG 12-Lead (Completed)   Basic metabolic panel   Abnormal nuclear stress test (Chronic)    Stress test suggested basal inferoseptal and and apical inferior perfusion defect.  No culprit lesions found on cath.  Only 30% proximal RCA and mid to distal LAD.  Interestingly, LVEDP was quite elevated.  FALSE-POSITIVE STRESS TEST      Relevant Orders   EKG 12-Lead (Completed)   Basic metabolic panel   Essential hypertension (Chronic)    Blood pressures are borderline elevated today at 138/90 mmHg.  He is at home he says he is been 130s /60s and his PCP office is 118/50.  With reducing the morning dose of metoprolol 25 mg daily (add valsartan 40 mg in the morning.      Relevant Medications   rosuvastatin (CRESTOR) 20 MG tablet   valsartan (DIOVAN) 40 MG tablet   metoprolol tartrate (LOPRESSOR) 50 MG tablet   Other Relevant Orders   Basic metabolic panel   Hyperlipidemia due to dietary fat intake (Chronic)    Lipids are clearly not controlled.  Not  currently on statin.  Apparently just had labs checked by PCP.  Apparently was recently started on rosuvastatin by PCP.  Unknown dose.      Relevant Medications   rosuvastatin (CRESTOR) 20 MG tablet   valsartan (DIOVAN) 40 MG tablet   metoprolol tartrate (LOPRESSOR) 50 MG tablet   Morbid obesity (HCC) (Chronic)    Unfortunately, he gained all the weight back that he had lost.  In fact, he gained more weight back now up 27 pounds.  Stressed the importance of dietary modification.  He needs to cut back alcohol and dietary intake as well as increase exercise.      DOE (dyspnea on exertion) (Chronic)    He did feel better after cardioversion, but he is now gained a bunch of weight since his heart catheterization   Which basically ruled out CAD as an etiology.  Recommendation is to work on weight loss and maintain adequate rhythm control.        ===================================  HPI:    Michael Fuller is a 60 y.o. male with a PMH notable for new diagnosis of  [place] who presents today for 55-month and post cath follow-up.  He was initially seen in December 2021 by Dr. Domenic Polite and [facility] for A. fib RVR in the setting of COVID infection.  Plan was for him to go to the A. fib clinic, but he ended up  scheduled to see me on December 27, 2020.  He underwent cardioversion on February 24 following Myoview stress test.  Michael Fuller was last seen on January 18, 2021 follow-up stress test showing reversible perfusion defect.  Therefore referred for invasive evaluation and catheterization.  He had lost about 15 pounds that he had gained back.  Notably less dyspneic following cardioversion.  He has been maintained on Multaq for rate/rhythm control, on Xarelto for anticoagulation.  Recent Hospitalizations: Outpatient cath 02/10/2021   Reviewed  CV studies:    The following studies were reviewed today: (if available, images/films reviewed: From  Epic Chart or Care Everywhere) . Cardiac Cath 02/10/2021: Ostial proximal RCA 30%.  Mid to distal LAD 30% distal LAD tapers to small caliber vessel but does not reach the apex.  Severely elevated LVEDP of 30 mmHg. ==> false-positive stress test, but diastolic heart failure..  Interval History:   Michael Fuller returns today indicating that he is doing "OK" - just upset about weight gain & fatigue.  Feels tired all the time.  Lack of "get up & go" -- OK once he starts, but hard to start.  Otherwise no SSx of recurrent rapid/irregular heartbeats (of Afib).  He says that he has tried to make adjustments to his diet, he is cutting back on his fried foods and other unhealthy foods.  Also quit drinking beer.  He thinks that he is more fatigued because of his weight gain.  Has pending sleep study test,  CV Review of Symptoms (Summary) Cardiovascular ROS: positive for - dyspnea on exertion and Exercise intolerance, fatigue negative for - chest pain, edema, irregular heartbeat, orthopnea, palpitations, paroxysmal nocturnal dyspnea, rapid heart rate, shortness of breath or Syncope/near syncope or TIA/amaurosis fugax, claudication.  The patient does not have symptoms concerning for COVID-19 infection (fever, chills, cough, or new shortness of breath).   REVIEWED OF SYSTEMS   Review of Systems  Constitutional: Positive for malaise/fatigue. Negative for weight loss (Weight gain).  HENT: Negative for congestion.   Respiratory: Negative for cough and shortness of breath.   Cardiovascular: Negative for leg swelling.  Gastrointestinal: Negative for abdominal pain, blood in stool and melena.  Genitourinary: Negative for hematuria.  Musculoskeletal: Positive for joint pain.  Neurological: Negative for dizziness.  Endo/Heme/Allergies: Negative for environmental allergies.  Psychiatric/Behavioral: Negative for depression and memory loss. The patient does not have insomnia (Just does not feel well rested).     I have reviewed and (if needed) personally updated the patient's problem list, medications, allergies, past medical and surgical history, social and family history.   PAST MEDICAL HISTORY   Past Medical History:  Diagnosis Date  . Anxiety disorder   . Complication of anesthesia    Nausae  . COVID-19 virus infection 11/08/2020   Cough and congestion for several weeks with 4 to 5 days worsening.  Marland Kitchen DJD (degenerative joint disease), lumbosacral   . Hyperlipidemia due to dietary fat intake   . Nephrolithiasis   . Osteoarthritis involving multiple joints on both sides of body   . Persistent atrial fibrillation ([place]) 10/2020   Initial diagnosis with the setting of COVID-19 infection  . RLS (restless legs syndrome)     PAST SURGICAL HISTORY   Past Surgical History:  Procedure Laterality Date  . BACK SURGERY  1996   Lumbosacral spine--two surgeries  . CARDIOVERSION N/A 01/12/2021   Procedure: SUCCESSFUL CARDIOVERSION;  Surgeon: Dorothy Spark, MD;  Location: [facility];  Service: Cardiovascular;  Laterality: N/A;  .  CARPAL TUNNEL RELEASE Bilateral   . LEFT HEART CATH AND CORONARY ANGIOGRAPHY N/A 02/10/2021   Procedure: LEFT HEART CATH AND CORONARY ANGIOGRAPHY;  Surgeon: Leonie Man, MD;  Location: [facility];  Service: Cardiovascular; Ostial proximal RCA 30%.  Mid to distal LAD 30% distal LAD tapers to small caliber vessel but does not reach the apex.  Severely elevated LVEDP of 30 mmHg. ==> false-positive stress test, but diastolic heart failure..  . NM MYOVIEW LTD  01/11/2021    INTERMEDIATE RISK study consistent with ischemia.  Medium size mild severity reversible perfusion defect in the basal inferoseptal, mid inferoseptal and apical inferior location.  Unable to calculate EF and wall motion due to A. fib. --FALSE POSITIVE  . SHOULDER SURGERY Bilateral   . TRANSTHORACIC ECHOCARDIOGRAM  11/25/2020   (Post COVID & New Afib): EF 65 to 70%.  Hyperdynamic LV.  Normal  diastolic pressures.  Moderate LA dilation.  Otherwise normal study.  Valves, normal RV/RA pressure.   Immunization History  Administered Date(s) Administered  . Influenza,inj,Quad PF,6+ Mos 08/28/2019    MEDICATIONS/ALLERGIES   Current Meds  Medication Sig  . dronedarone (MULTAQ) 400 MG tablet Take 1 tablet (400 mg total) by mouth 2 (two) times daily with a meal.  . pramipexole (MIRAPEX) 0.5 MG tablet Take 0.5 mg by mouth daily.  . rivaroxaban (XARELTO) 20 MG TABS tablet Take 20 mg by mouth daily with supper.  . rosuvastatin (CRESTOR) 20 MG tablet Take 20 mg by mouth at bedtime.  . valsartan (DIOVAN) 40 MG tablet Take 1 tablet (40 mg total) by mouth every morning.  . venlafaxine (EFFEXOR) 75 MG tablet Take 75 mg by mouth 2 (two) times daily.  . [DISCONTINUED] metoprolol tartrate (LOPRESSOR) 50 MG tablet Take 1 tablet (50 mg total) by mouth 2 (two) times daily.    No Known Allergies  SOCIAL HISTORY/FAMILY HISTORY   Reviewed in Epic:  Pertinent findings:  Social History   Tobacco Use  . Smoking status: Never Smoker  . Smokeless tobacco: Never Used  Substance Use Topics  . Alcohol use: Yes    Alcohol/week: 4.0 standard drinks    Types: 4 Cans of beer per week  . Drug use: Never   Social History   Social History Narrative   Married father of two sons-28 and 33.  He lives with his wife in [place].   He drinks maybe 4-5 beers a day for "pain medication"    His job as a working in shift, often working night shift.   Does not regularly exercise per se. ->  His work involves doing jobs around farm.  Unfortunately hasn't been all that active recently since his COVID-19 infection, and has been scared to be very aggressive with being on Eliquis.    OBJCTIVE -PE, EKG, labs   Wt Readings from Last 3 Encounters:  01/18/21 (!) 306 lb 12.8 oz (1139.2 kg)  02/10/21 290 lb (131.5 kg)   Filed Weights   03/30/21 0856  Weight: (!) 317 lb (143.8 kg)   Physical Exam: BP 138/90    Pulse 61   Ht 6\' 2"  (1.88 m)   Wt (!) 317 lb (143.8 kg)   SpO2 97%   BMI 40.70 kg/m  Physical Exam Vitals reviewed.  Constitutional:      General: He is not in acute distress.    Appearance: Normal appearance. He is obese. He is not toxic-appearing or diaphoretic.     Comments: Morbidly obese.  Otherwise well-groomed.  HENT:  Head: Normocephalic and atraumatic.  Neck:     Vascular: No carotid bruit, hepatojugular reflux or JVD.  Cardiovascular:     Rate and Rhythm: Normal rate and regular rhythm.  No extrasystoles are present.    Chest Wall: PMI is not displaced (Difficult to palpate).     Pulses: Normal pulses.     Heart sounds: S1 normal and S2 normal. Heart sounds are distant. No murmur heard. No friction rub. No gallop.   Pulmonary:     Effort: Pulmonary effort is normal. No respiratory distress.     Breath sounds: Normal breath sounds. No wheezing, rhonchi or rales.  Chest:     Chest wall: No tenderness.  Musculoskeletal:        General: Swelling (Trivial bilateral) present. Normal range of motion.     Cervical back: Normal range of motion and neck supple.  Skin:    General: Skin is warm and dry.  Neurological:     General: No focal deficit present.     Mental Status: He is alert and oriented to person, place, and time.  Psychiatric:        Mood and Affect: Mood normal.        Behavior: Behavior normal.        Thought Content: Thought content normal.        Judgment: Judgment normal.     Adult ECG Report  Rate: 61 ;  Rhythm: normal sinus rhythm and normal axis, intervals & durations;   Narrative Interpretation: stable   Recent Labs:  PCP just checked -last documented lipids: TC 218, TG 89, HDL 44, LDL of 158.   No results found for: CHOL, HDL, LDLCALC, LDLDIRECT, TRIG, CHOLHDL Lab Results  Component Value Date   CREATININE 1.02 02/06/2021   BUN 11 02/06/2021   NA 143 02/06/2021   K 4.7 02/06/2021   CL 105 02/06/2021   CO2 22 02/06/2021   CBC Latest  Ref Rng & Units 02/06/2021 01/09/2021 11/08/2020  WBC 3.4 - 10.8 x10E3/uL 8.6 7.5 5.4  Hemoglobin 13.0 - 17.7 g/dL 15.0 15.0 15.3  Hematocrit 37.5 - 51.0 % 44.4 44.0 44.8  Platelets 150 - 450 x10E3/uL 181 205 174    No results found for: TSH  ==================================================  COVID-19 Education: The signs and symptoms of COVID-19 were discussed with the patient and how to seek care for testing (follow up with PCP or arrange E-visit).    I spent a total of 28 minutes with the patient spent in direct patient consultation.  Additional time spent with chart review  / charting (studies, outside notes, etc): 38min Total Time: 36 min   Current medicines are reviewed at length with the patient today.  (+/- concerns) n/a  This visit occurred during the SARS-CoV-2 public health emergency.  Safety protocols were in place, including screening questions prior to the visit, additional usage of staff PPE, and extensive cleaning of exam room while observing appropriate contact time as indicated for disinfecting solutions.  Notice: This dictation was prepared with Dragon dictation along with smaller phrase technology. Any transcriptional errors that result from this process are unintentional and may not be corrected upon review.  Patient Instructions / Medication Changes & Studies & Tests Ordered   Patient Instructions  Medication Instructions:  Metoprolol 25 mg  ( 1/2 tablet of 50 mg ) in the morning and 50 mg in the evening   start taking Valsartan 40 mg in the morning    *If you need a refill on  your cardiac medications before your next appointment, please call your pharmacy*   Lab Work:  2 weeks after starting valsartan please have lb work done- you may go anytime of the day -to [facility] in [place].  If you have labs (blood work) drawn today and your tests are completely normal, you will receive your results only by: Marland Kitchen MyChart Message (if you have MyChart) OR . A paper  copy in the mail If you have any lab test that is abnormal or we need to change your treatment, we will call you to review the results.   Testing/Procedures: Not needed   Follow-Up: At [facility], you and your health needs are our priority.  As part of our continuing mission to provide you with exceptional heart care, we have created designated Provider Care Teams.  These Care Teams include your primary Cardiologist (physician) and Advanced Practice Providers (APPs -  Physician Assistants and Nurse Practitioners) who all work together to provide you with the care you need, when you need it.  We recommend signing up for the patient portal called "MyChart".  Sign up information is provided on this After Visit Summary.  MyChart is used to connect with patients for Virtual Visits (Telemedicine).  Patients are able to view lab/test results, encounter notes, upcoming appointments, etc.  Non-urgent messages can be sent to your provider as well.   To learn more about what you can do with MyChart, go to [email or web address].    Your next appointment:   6 month(s)  The format for your next appointment:   In Person  Provider:   Glenetta Hew, MD   Other Instructions     Will obtain labs from primary    Studies Ordered:   Orders Placed This Encounter  Procedures  . Basic metabolic panel  . EKG 12-Lead     Glenetta Hew, M.D., M.S. Interventional Cardiologist   Pager # [phone] Phone # [phone] [address] [place], [place] [address]   Thank you for choosing Heartcare at [facility]!!

## 2021-03-30 NOTE — Patient Instructions (Signed)
Medication Instructions:  Metoprolol 25 mg  ( 1/2 tablet of 50 mg ) in the morning and 50 mg in the evening   start taking Valsartan 40 mg in the morning    *If you need a refill on your cardiac medications before your next appointment, please call your pharmacy*   Lab Work:  2 weeks after starting valsartan please have lb work done- you may go anytime of the day -to [facility] in [place].  If you have labs (blood work) drawn today and your tests are completely normal, you will receive your results only by: Marland Kitchen MyChart Message (if you have MyChart) OR . A paper copy in the mail If you have any lab test that is abnormal or we need to change your treatment, we will call you to review the results.   Testing/Procedures: Not needed   Follow-Up: At [facility], you and your health needs are our priority.  As part of our continuing mission to provide you with exceptional heart care, we have created designated Provider Care Teams.  These Care Teams include your primary Cardiologist (physician) and Advanced Practice Providers (APPs -  Physician Assistants and Nurse Practitioners) who all work together to provide you with the care you need, when you need it.  We recommend signing up for the patient portal called "MyChart".  Sign up information is provided on this After Visit Summary.  MyChart is used to connect with patients for Virtual Visits (Telemedicine).  Patients are able to view lab/test results, encounter notes, upcoming appointments, etc.  Non-urgent messages can be sent to your provider as well.   To learn more about what you can do with MyChart, go to [email or web address].    Your next appointment:   6 month(s)  The format for your next appointment:   In Person  Provider:   Glenetta Hew, MD   Other Instructions     Will obtain labs from primary

## 2021-04-20 ENCOUNTER — Ambulatory Visit: Payer: 59 | Admitting: General Surgery

## 2021-04-22 ENCOUNTER — Encounter: Payer: Self-pay | Admitting: Cardiology

## 2021-04-22 NOTE — Assessment & Plan Note (Signed)
He did feel better after cardioversion, but he is now gained a bunch of weight since his heart catheterization   Which basically ruled out CAD as an etiology.  Recommendation is to work on weight loss and maintain adequate rhythm control.

## 2021-04-22 NOTE — Assessment & Plan Note (Signed)
Cardioverted on February 24.  Currently on Xarelto for anticoagulation.  He is on Multaq, and presumably maintaining sinus rhythm.  No breakthrough spells as far symptoms go.  He is on metoprolol 50 twice daily, but with him having some fatigue, we will reduce his morning dose of metoprolol 25 mg.  He is due to be seen in A. fib clinic soon.

## 2021-04-22 NOTE — Assessment & Plan Note (Signed)
Blood pressures are borderline elevated today at 138/90 mmHg.  He is at home he says he is been 130s /60s and his PCP office is 118/50.  With reducing the morning dose of metoprolol 25 mg daily (add valsartan 40 mg in the morning.

## 2021-04-22 NOTE — Assessment & Plan Note (Signed)
Stress test suggested basal inferoseptal and and apical inferior perfusion defect.  No culprit lesions found on cath.  Only 30% proximal RCA and mid to distal LAD.  Interestingly, LVEDP was quite elevated.  FALSE-POSITIVE STRESS TEST

## 2021-04-22 NOTE — Assessment & Plan Note (Signed)
Unfortunately, he gained all the weight back that he had lost.  In fact, he gained more weight back now up 27 pounds.  Stressed the importance of dietary modification.  He needs to cut back alcohol and dietary intake as well as increase exercise.

## 2021-04-22 NOTE — Assessment & Plan Note (Addendum)
Lipids are clearly not controlled.  Not currently on statin.  Apparently just had labs checked by PCP.  Apparently was recently started on rosuvastatin by PCP.  Unknown dose.

## 2021-04-25 ENCOUNTER — Ambulatory Visit (INDEPENDENT_AMBULATORY_CARE_PROVIDER_SITE_OTHER): Payer: 59 | Admitting: General Surgery

## 2021-04-25 ENCOUNTER — Other Ambulatory Visit: Payer: Self-pay

## 2021-04-25 ENCOUNTER — Encounter: Payer: Self-pay | Admitting: General Surgery

## 2021-04-25 VITALS — BP 150/85 | HR 75 | Temp 98.1°F | Resp 16 | Ht 74.0 in | Wt 314.0 lb

## 2021-04-25 DIAGNOSIS — L918 Other hypertrophic disorders of the skin: Secondary | ICD-10-CM

## 2021-04-25 DIAGNOSIS — D492 Neoplasm of unspecified behavior of bone, soft tissue, and skin: Secondary | ICD-10-CM

## 2021-04-25 NOTE — H&P (Signed)
Michael Fuller; [id number]; 03-07-1961   HPI Patient is a 60 year old white male who was referred to my care by Dr. Asencion Noble for evaluation treatment of a skin neoplasm on his right buttock.  Is been present for many years but seems to cause recurrent episodes of tenderness with irritation.  He has been recently put on Xarelto due to intermittent atrial fibrillation.  He has been off Xarelto for 2 days. Past Medical History:  Diagnosis Date  . Anxiety disorder   . Complication of anesthesia    Nausae  . COVID-19 virus infection 11/08/2020   Cough and congestion for several weeks with 4 to 5 days worsening.  Marland Kitchen DJD (degenerative joint disease), lumbosacral   . Hyperlipidemia due to dietary fat intake   . Nephrolithiasis   . Osteoarthritis involving multiple joints on both sides of body   . Persistent atrial fibrillation ([place]) 10/2020   Initial diagnosis with the setting of COVID-19 infection  . RLS (restless legs syndrome)     Past Surgical History:  Procedure Laterality Date  . BACK SURGERY  1996   Lumbosacral spine--two surgeries  . CARDIOVERSION N/A 01/12/2021   Procedure: SUCCESSFUL CARDIOVERSION;  Surgeon: Dorothy Spark, MD;  Location: [facility];  Service: Cardiovascular;  Laterality: N/A;  . CARPAL TUNNEL RELEASE Bilateral   . LEFT HEART CATH AND CORONARY ANGIOGRAPHY N/A 02/10/2021   Procedure: LEFT HEART CATH AND CORONARY ANGIOGRAPHY;  Surgeon: Leonie Man, MD;  Location: [facility];  Service: Cardiovascular; Ostial proximal RCA 30%.  Mid to distal LAD 30% distal LAD tapers to small caliber vessel but does not reach the apex.  Severely elevated LVEDP of 30 mmHg. ==> false-positive stress test, but diastolic heart failure..  . NM MYOVIEW LTD  01/11/2021    INTERMEDIATE RISK study consistent with ischemia.  Medium size mild severity reversible perfusion defect in the basal inferoseptal, mid inferoseptal and apical inferior location.  Unable to calculate EF and  wall motion due to A. fib. --FALSE POSITIVE  . SHOULDER SURGERY Bilateral   . TRANSTHORACIC ECHOCARDIOGRAM  11/25/2020   (Post COVID & New Afib): EF 65 to 70%.  Hyperdynamic LV.  Normal diastolic pressures.  Moderate LA dilation.  Otherwise normal study.  Valves, normal RV/RA pressure.    Family History  Problem Relation Age of Onset  . Hypothyroidism Mother   . Hypertension Father   . Healthy Sister   . Healthy Brother   . Hypertension Maternal Grandmother   . Diabetes Mellitus II Paternal Grandfather     Current Outpatient Medications on File Prior to Visit  Medication Sig Dispense Refill  . dronedarone (MULTAQ) 400 MG tablet Take 1 tablet (400 mg total) by mouth 2 (two) times daily with a meal. 60 tablet 6  . metoprolol tartrate (LOPRESSOR) 50 MG tablet Take 25 mg ( 1/2 tablet) in the morning and 50 mg in the evening. (Patient taking differently: Take 25-50 mg by mouth See admin instructions. Take 25 mg  in the morning and 50 mg in the evening.) 180 tablet 3  . pramipexole (MIRAPEX) 1 MG tablet Take 1 mg by mouth daily.    . rivaroxaban (XARELTO) 20 MG TABS tablet Take 20 mg by mouth daily with supper.    . rosuvastatin (CRESTOR) 20 MG tablet Take 20 mg by mouth at bedtime.    . valsartan (DIOVAN) 40 MG tablet Take 1 tablet (40 mg total) by mouth every morning. 90 tablet 2  . venlafaxine (EFFEXOR) 75 MG  tablet Take 75 mg by mouth 2 (two) times daily.     No current facility-administered medications on file prior to visit.    No Known Allergies  Social History   Substance and Sexual Activity  Alcohol Use Yes  . Alcohol/week: 4.0 standard drinks  . Types: 4 Cans of beer per week    Social History   Tobacco Use  Smoking Status Never Smoker  Smokeless Tobacco Never Used    Review of Systems  Constitutional: Negative.   HENT: Negative.   Eyes: Negative.   Respiratory: Negative.   Cardiovascular: Negative.   Gastrointestinal: Negative.   Genitourinary: Negative.    Musculoskeletal: Positive for back pain and joint pain.  Skin: Negative.   Neurological: Negative.   Endo/Heme/Allergies: Negative.   Psychiatric/Behavioral: Negative.     Objective   Vitals:   04/25/21 1325  BP: (!) 150/85  Pulse: 75  Resp: 16  Temp: 98.1 F (36.7 C)  SpO2: 96%    Physical Exam Vitals reviewed.  Constitutional:      Appearance: Normal appearance. He is obese. He is not ill-appearing.  HENT:     Head: Normocephalic and atraumatic.  Cardiovascular:     Rate and Rhythm: Normal rate. Rhythm irregular.     Heart sounds: Normal heart sounds. No murmur heard. No friction rub. No gallop.   Pulmonary:     Effort: Pulmonary effort is normal.     Breath sounds: Normal breath sounds.  Skin:    General: Skin is warm and dry.     Comments: Approximately 1.5 cm wide based skin tag present on right buttock.  Neurological:     Mental Status: He is alert and oriented to person, place, and time.   Primary care notes reviewed  Assessment  Skin neoplasm, probable skin tag, right buttock Plan   Scheduled for excision of skin neoplasm, right buttock in the minor procedure room on 04/26/2021.  The risks and benefits of the procedure were fully explained to the patient, who gave informed consent.

## 2021-04-25 NOTE — Patient Instructions (Signed)
Skin Tag, Adult  A skin tag (acrochordon) is a soft, extra growth of skin. Most skin tags are skin-colored and rarely bigger than a pencil eraser. They commonly form in areas where there is frequent rubbing, or friction, on the skin. This may be where there are folds in the skin, such as the eyelids, neck, armpit, or groin. Skin tags are not dangerous, and they do not spread from person to person (are not contagious). You may have one skin tag or several. Skin tags do not require treatment. However, your health care provider may recommend removal of a skin tag if it:  Gets irritated from clothing or jewelry.  Bleeds.  Is visible and unsightly. What are the causes? This condition is linked with:  Increasing age.  Pregnancy.  Diabetes.  Obesity. What are the signs or symptoms? Skin tags usually do not cause symptoms unless they get irritated by items touching your skin, such as clothing or jewelry. When this happens, you may have pain, itching, or bleeding. How is this diagnosed? This condition is diagnosed with an evaluation from your health care provider. No testing is needed for diagnosis. How is this treated? Treatment for this condition depends on whether you have symptoms. If a skin tag needs to be removed, your health care provider can remove it with:  A simple surgical procedure using scissors.  A procedure that involves freezing your skin tag with a gas in liquid form (liquid nitrogen).  A procedure that uses heat to destroy your skin tag (electrodessication). Your health care provider may also remove your skin tag if it is visible or unsightly, Follow these instructions at home:  Watch for any changes in your skin tag. A normal skin tag does not require any other special care at home.  Take over-the-counter and prescription medicines only as told by your health care provider.  Keep all follow-up visits as told by your health care provider. This is important. Contact a  health care provider if:  You have a skin tag that: ? Becomes painful. ? Changes color. ? Bleeds. ? Swells. Summary  Skin tags are soft, extra growths of skin found in areas of frequent rubbing or friction.  Skin tags usually do not cause symptoms. If symptoms occur, you may have pain, itching, or bleeding.  If your skin tag causes symptoms or is unsightly, your health care provider can remove it. This information is not intended to replace advice given to you by your health care provider. Make sure you discuss any questions you have with your health care provider. Document Revised: 09/07/2019 Document Reviewed: 09/07/2019 Elsevier Patient Education  [employer].

## 2021-04-25 NOTE — Progress Notes (Signed)
Michael Fuller; [id number]; Apr 06, 1961   HPI Patient is a 60 year old white male who was referred to my care by Dr. Asencion Noble for evaluation treatment of a skin neoplasm on his right buttock.  Is been present for many years but seems to cause recurrent episodes of tenderness with irritation.  He has been recently put on Xarelto due to intermittent atrial fibrillation.  He has been off Xarelto for 2 days. Past Medical History:  Diagnosis Date  . Anxiety disorder   . Complication of anesthesia    [place]  . COVID-19 virus infection 11/08/2020   Cough and congestion for several weeks with 4 to 5 days worsening.  Marland Kitchen DJD (degenerative joint disease), lumbosacral   . Hyperlipidemia due to dietary fat intake   . Nephrolithiasis   . Osteoarthritis involving multiple joints on both sides of body   . Persistent atrial fibrillation ([place]) 10/2020   Initial diagnosis with the setting of COVID-19 infection  . RLS (restless legs syndrome)     Past Surgical History:  Procedure Laterality Date  . BACK SURGERY  1996   Lumbosacral spine--two surgeries  . CARDIOVERSION N/A 01/12/2021   Procedure: SUCCESSFUL CARDIOVERSION;  Surgeon: Dorothy Spark, MD;  Location: [facility] ENDOSCOPY;  Service: Cardiovascular;  Laterality: N/A;  . CARPAL TUNNEL RELEASE Bilateral   . LEFT HEART CATH AND CORONARY ANGIOGRAPHY N/A 02/10/2021   Procedure: LEFT HEART CATH AND CORONARY ANGIOGRAPHY;  Surgeon: Leonie Man, MD;  Location: [place] [facility];  Service: Cardiovascular; Ostial proximal RCA 30%.  Mid to distal LAD 30% distal LAD tapers to small caliber vessel but does not reach the apex.  Severely elevated LVEDP of 30 mmHg. ==> false-positive stress test, but diastolic heart failure..  . NM MYOVIEW LTD  01/11/2021    INTERMEDIATE RISK study consistent with ischemia.  Medium size mild severity reversible perfusion defect in the basal inferoseptal, mid inferoseptal and apical inferior location.  Unable to calculate EF and  wall motion due to A. fib. --FALSE POSITIVE  . SHOULDER SURGERY Bilateral   . TRANSTHORACIC ECHOCARDIOGRAM  11/25/2020   (Post COVID & New Afib): EF 65 to 70%.  Hyperdynamic LV.  Normal diastolic pressures.  Moderate LA dilation.  Otherwise normal study.  Valves, normal RV/RA pressure.    Family History  Problem Relation Age of Onset  . Hypothyroidism Mother   . Hypertension Father   . Healthy Sister   . Healthy Brother   . Hypertension Maternal Grandmother   . Diabetes Mellitus II Paternal Grandfather     Current Outpatient Medications on File Prior to Visit  Medication Sig Dispense Refill  . dronedarone (MULTAQ) 400 MG tablet Take 1 tablet (400 mg total) by mouth 2 (two) times daily with a meal. 60 tablet 6  . metoprolol tartrate (LOPRESSOR) 50 MG tablet Take 25 mg ( 1/2 tablet) in the morning and 50 mg in the evening. (Patient taking differently: Take 25-50 mg by mouth See admin instructions. Take 25 mg  in the morning and 50 mg in the evening.) 180 tablet 3  . pramipexole (MIRAPEX) 1 MG tablet Take 1 mg by mouth daily.    . rivaroxaban (XARELTO) 20 MG TABS tablet Take 20 mg by mouth daily with supper.    . rosuvastatin (CRESTOR) 20 MG tablet Take 20 mg by mouth at bedtime.    . valsartan (DIOVAN) 40 MG tablet Take 1 tablet (40 mg total) by mouth every morning. 90 tablet 2  . venlafaxine (EFFEXOR) 75 MG  tablet Take 75 mg by mouth 2 (two) times daily.     No current facility-administered medications on file prior to visit.    No Known Allergies  Social History   Substance and Sexual Activity  Alcohol Use Yes  . Alcohol/week: 4.0 standard drinks  . Types: 4 Cans of beer per week    Social History   Tobacco Use  Smoking Status Never Smoker  Smokeless Tobacco Never Used    Review of Systems  Constitutional: Negative.   HENT: Negative.   Eyes: Negative.   Respiratory: Negative.   Cardiovascular: Negative.   Gastrointestinal: Negative.   Genitourinary: Negative.    Musculoskeletal: Positive for back pain and joint pain.  Skin: Negative.   Neurological: Negative.   Endo/Heme/Allergies: Negative.   Psychiatric/Behavioral: Negative.     Objective   Vitals:   04/25/21 1325  BP: (!) 150/85  Pulse: 75  Resp: 16  Temp: 98.1 F (36.7 C)  SpO2: 96%    Physical Exam Vitals reviewed.  Constitutional:      Appearance: Normal appearance. He is obese. He is not ill-appearing.  HENT:     Head: Normocephalic and atraumatic.  Cardiovascular:     Rate and Rhythm: Normal rate. Rhythm irregular.     Heart sounds: Normal heart sounds. No murmur heard. No friction rub. No gallop.   Pulmonary:     Effort: Pulmonary effort is normal.     Breath sounds: Normal breath sounds.  Skin:    General: Skin is warm and dry.     Comments: Approximately 1.5 cm wide based skin tag present on right buttock.  Neurological:     Mental Status: He is alert and oriented to person, place, and time.   Primary care notes reviewed  Assessment  Skin neoplasm, probable skin tag, right buttock Plan   Scheduled for excision of skin neoplasm, right buttock in the minor procedure room on 04/26/2021.  The risks and benefits of the procedure were fully explained to the patient, who gave informed consent.

## 2021-04-26 ENCOUNTER — Encounter (HOSPITAL_COMMUNITY): Payer: Self-pay | Admitting: General Surgery

## 2021-04-26 ENCOUNTER — Encounter (HOSPITAL_COMMUNITY)
Admission: RE | Disposition: A | Discharge status: Home / Self Care | Payer: Self-pay | Source: Home / Self Care | Attending: General Surgery

## 2021-04-26 ENCOUNTER — Ambulatory Visit (HOSPITAL_COMMUNITY)
Admission: RE | Admit: 2021-04-26 | Discharge: 2021-04-26 | Disposition: A | Discharge status: Home / Self Care | Payer: 59 | Attending: General Surgery | Admitting: General Surgery

## 2021-04-26 DIAGNOSIS — Z79899 Other long term (current) drug therapy: Secondary | ICD-10-CM | POA: Diagnosis not present

## 2021-04-26 DIAGNOSIS — Z8616 Personal history of COVID-19: Secondary | ICD-10-CM | POA: Diagnosis not present

## 2021-04-26 DIAGNOSIS — D171 Benign lipomatous neoplasm of skin and subcutaneous tissue of trunk: Secondary | ICD-10-CM | POA: Diagnosis not present

## 2021-04-26 DIAGNOSIS — Z7901 Long term (current) use of anticoagulants: Secondary | ICD-10-CM | POA: Insufficient documentation

## 2021-04-26 DIAGNOSIS — L918 Other hypertrophic disorders of the skin: Secondary | ICD-10-CM | POA: Insufficient documentation

## 2021-04-26 DIAGNOSIS — L989 Disorder of the skin and subcutaneous tissue, unspecified: Secondary | ICD-10-CM | POA: Diagnosis present

## 2021-04-26 HISTORY — PX: MASS EXCISION: SHX2000

## 2021-04-26 SURGERY — MINOR EXCISION OF MASS
Anesthesia: LOCAL | Laterality: Right

## 2021-04-26 MED ORDER — CHLORHEXIDINE GLUCONATE CLOTH 2 % EX PADS: 6.0000 | MEDICATED_PAD | Freq: Once | CUTANEOUS | Status: DC

## 2021-04-26 MED ORDER — LIDOCAINE HCL (PF) 1 % IJ SOLN
INTRAMUSCULAR | Status: AC
  Filled 2021-04-26: qty 30

## 2021-04-26 MED ORDER — LIDOCAINE HCL (PF) 1 % IJ SOLN
INTRAMUSCULAR | Status: DC | PRN
  Administered 2021-04-26: 4 mL via SUBCUTANEOUS

## 2021-04-26 SURGICAL SUPPLY — 31 items
ADH SKN CLS APL DERMABOND .7 (GAUZE/BANDAGES/DRESSINGS) ×1
APL PRP STRL LF DISP 70% ISPRP (MISCELLANEOUS) ×1
CHLORAPREP W/TINT 26 (MISCELLANEOUS) ×2 IMPLANT
CLOTH BEACON ORANGE TIMEOUT ST (SAFETY) ×2 IMPLANT
COVER LIGHT HANDLE STERIS (MISCELLANEOUS) ×4 IMPLANT
COVER WAND RF STERILE (DRAPES) ×2 IMPLANT
DERMABOND ADVANCED (GAUZE/BANDAGES/DRESSINGS) ×1
DERMABOND ADVANCED .7 DNX12 (GAUZE/BANDAGES/DRESSINGS) ×1 IMPLANT
ELECT REM PT RETURN 9FT ADLT (ELECTROSURGICAL) ×2
ELECTRODE REM PT RTRN 9FT ADLT (ELECTROSURGICAL) ×1 IMPLANT
GLOVE SURG SS PI 7.5 STRL IVOR (GLOVE) ×2 IMPLANT
GLOVE SURG UNDER POLY LF SZ7 (GLOVE) ×4 IMPLANT
GOWN STRL REUS W/TWL LRG LVL3 (GOWN DISPOSABLE) ×4 IMPLANT
KIT TURNOVER KIT A (KITS) ×2 IMPLANT
MANIFOLD NEPTUNE II (INSTRUMENTS) ×2 IMPLANT
NDL HYPO 18GX1.5 BLUNT FILL (NEEDLE) IMPLANT
NDL HYPO 25X1 1.5 SAFETY (NEEDLE) ×1 IMPLANT
NEEDLE HYPO 18GX1.5 BLUNT FILL (NEEDLE) IMPLANT
NEEDLE HYPO 25X1 1.5 SAFETY (NEEDLE) ×2 IMPLANT
NS IRRIG 1000ML POUR BTL (IV SOLUTION) ×2 IMPLANT
PACK MINOR (CUSTOM PROCEDURE TRAY) ×2 IMPLANT
PAD ARMBOARD 7.5X6 YLW CONV (MISCELLANEOUS) ×2 IMPLANT
SET BASIN LINEN APH (SET/KITS/TRAYS/PACK) ×2 IMPLANT
SOL PREP PROV IODINE SCRUB 4OZ (MISCELLANEOUS) IMPLANT
STRIP CLOSURE SKIN 1/2X4 (GAUZE/BANDAGES/DRESSINGS) ×2 IMPLANT
SUT MNCRL AB 4-0 PS2 18 (SUTURE) ×2 IMPLANT
SUT PROLENE 3 0 PS 1 (SUTURE) IMPLANT
SUT VIC AB 3-0 SH 27 (SUTURE)
SUT VIC AB 3-0 SH 27X BRD (SUTURE) IMPLANT
SYR BULB IRRIG 60ML STRL (SYRINGE) ×2 IMPLANT
SYR CONTROL 10ML LL (SYRINGE) ×2 IMPLANT

## 2021-04-26 NOTE — Op Note (Signed)
Patient:  Michael Fuller  DOB:  18-Dec-1960  MRN:  [id number]   Preop Diagnosis: Skin neoplasm, right buttock  Postop Diagnosis: Same  Procedure: Excision of lipomatous skin lesion, right buttock  Surgeon: Aviva Signs, MD  Anes: Local  Indications: Patient is a 60 year old white male who presents with a 2 cm wide based lipomatous skin tag of the right buttock.  He has held his Xarelto for 2 days.  The risks and benefits of the procedure were fully explained to the patient, who gave informed consent.  Procedure note: The patient was placed in the left lateral decubitus position.  The right buttock was prepped and draped using usual sterile technique with ChloraPrep.  Surgical site confirmation was performed.  1% Xylocaine was used for local anesthesia.  An incision was made along the base of the lipomatous skin tag.  The adipose tissue extended into the subcutaneous tissue.  This was excised without difficulty.  The lesion was disposed of.  A bleeding was controlled using Bovie electrocautery.  The wound was closed using 4-0 Monocryl subcuticular sutures.  Dermabond was applied.  The patient tolerated the procedure well.  He was discharged from the minor procedure room in good and stable condition.  Complications: None  EBL: Minimal  Specimen: None

## 2021-04-26 NOTE — Discharge Instructions (Signed)
SKin Tag Removal, Care After This sheet gives you information about how to care for yourself after your procedure. Your health care provider may also give you more specific instructions. If you have problems or questions, contact your health care provider. What can I expect after the procedure? After the procedure, it is common to have:  Mild pain.  Swelling.  Bruising. Follow these instructions at home: Bathing  Do not take baths, swim, or use a hot tub until your health care provider approves. Ask your health care provider if you may take showers. You may only be allowed to take sponge baths.  Keep your bandage (dressing) dry until your health care provider says it can be removed.   Incision care  Follow instructions from your health care provider about how to take care of your incision. Make sure you: ? Wash your hands with soap and water for at least 20 seconds before and after you change your dressing. If soap and water are not available, use hand sanitizer. ? Change your dressing as told by your health care provider. ? Leave stitches (sutures), skin glue, or adhesive strips in place. These skin closures may need to stay in place for 2 weeks or longer. If adhesive strip edges start to loosen and curl up, you may trim the loose edges. Do not remove adhesive strips completely unless your health care provider tells you to do that.  Check your incision area every day for signs of infection. Check for: ? More redness, swelling, or pain. ? Fluid or blood. ? Warmth. ? Pus or a bad smell.   Medicines  Take over-the-counter and prescription medicines only as told by your health care provider.  If you were prescribed an antibiotic medicine, use it as told by your health care provider. Do not stop using the antibiotic even if you start to feel better. General instructions  If you were given a sedative during the procedure, it can affect you for several hours. Do not drive or operate  machinery until your health care provider says that it is safe.  Do not use any products that contain nicotine or tobacco, such as cigarettes, e-cigarettes, and chewing tobacco. These can delay healing. If you need help quitting, ask your health care provider.  Return to your normal activities as told by your health care provider. Ask your health care provider what activities are safe for you.  Keep all follow-up visits as told by your health care provider. This is important.   Contact a health care provider if:  You have more redness, swelling, or pain around your incision.  You have fluid or blood coming from your incision.  Your incision feels warm to the touch.  You have pus or a bad smell coming from your incision.  You have pain that does not get better with medicine. Get help right away if:  You have chills or a fever.  You have severe pain. Summary  After the procedure, it is common to have mild pain, swelling, and bruising.  Follow instructions from your health care provider about how to take care of your incision.  Check your incision area every day for signs of infection.  Contact a health care provider if you have more redness, swelling, or pain around your incision. This information is not intended to replace advice given to you by your health care provider. Make sure you discuss any questions you have with your health care provider. Document Revised: 06/22/2019 Document Reviewed: 06/22/2019 Elsevier Patient  Education  2021 [employer].

## 2021-04-26 NOTE — Interval H&P Note (Signed)
History and Physical Interval Note:  04/26/2021 8:31 AM  Michael Fuller  has presented today for surgery, with the diagnosis of Skin neoplasm.  The various methods of treatment have been discussed with the patient and family. After consideration of risks, benefits and other options for treatment, the patient has consented to  Procedure(s): MINOR EXCISION OF SKIN NEOPLASM; BUTTOCK (Right) as a surgical intervention.  The patient's history has been reviewed, patient examined, no change in status, stable for surgery.  I have reviewed the patient's chart and labs.  Questions were answered to the patient's satisfaction.     Aviva Signs

## 2021-04-27 ENCOUNTER — Encounter (HOSPITAL_COMMUNITY): Payer: Self-pay | Admitting: General Surgery

## 2021-05-05 ENCOUNTER — Telehealth (INDEPENDENT_AMBULATORY_CARE_PROVIDER_SITE_OTHER): Payer: 59 | Admitting: General Surgery

## 2021-05-05 DIAGNOSIS — Z09 Encounter for follow-up examination after completed treatment for conditions other than malignant neoplasm: Secondary | ICD-10-CM

## 2021-05-05 NOTE — Telephone Encounter (Signed)
Virtual telephone visit performed with patient.  Patient states he is doing well and has no complaints.  I told him to call me should any problems arise.  As this was a postoperative visit within the global surgical period, this was not a billable visit.

## 2021-05-09 ENCOUNTER — Other Ambulatory Visit: Payer: Self-pay

## 2021-05-09 ENCOUNTER — Ambulatory Visit (HOSPITAL_BASED_OUTPATIENT_CLINIC_OR_DEPARTMENT_OTHER): Payer: 59 | Attending: Cardiology | Admitting: Cardiovascular Disease

## 2021-05-09 DIAGNOSIS — R0683 Snoring: Secondary | ICD-10-CM

## 2021-05-09 DIAGNOSIS — I4819 Other persistent atrial fibrillation: Secondary | ICD-10-CM | POA: Diagnosis not present

## 2021-05-09 DIAGNOSIS — G4733 Obstructive sleep apnea (adult) (pediatric): Secondary | ICD-10-CM | POA: Diagnosis not present

## 2021-05-16 ENCOUNTER — Encounter (HOSPITAL_BASED_OUTPATIENT_CLINIC_OR_DEPARTMENT_OTHER): Payer: Self-pay | Admitting: Cardiovascular Disease

## 2021-05-16 NOTE — Procedures (Signed)
            Patient Name: Michael Fuller, Michael Fuller Date: 05/09/2021 Gender: Male D.O.B: 1961-02-18 Age (years): 59 Referring Provider: Glenetta Hew Height (inches): 52 Interpreting Physician: Shelva Majestic MD, ABSM Weight (lbs): 305 RPSGT: Laren Everts BMI: 40 MRN: [id number] Neck Size: 20.50  CLINICAL INFORMATION Sleep Study Type: NPSG  Indication for sleep study: Excessive Daytime Sleepiness, Fatigue, Obesity, Snoring  Epworth Sleepiness Score: 10  SLEEP STUDY TECHNIQUE As per the AASM Manual for the Scoring of Sleep and Associated Events v2.3 (April 2016) with a hypopnea requiring 4% desaturations.  The channels recorded and monitored were frontal, central and occipital EEG, electrooculogram (EOG), submentalis EMG (chin), nasal and oral airflow, thoracic and abdominal wall motion, anterior tibialis EMG, snore microphone, electrocardiogram, and pulse oximetry.  MEDICATIONS Medications self-administered by patient taken the night of the study : MULTAQ, VENLAFAXINE, METOPROLOL TARTRATE, PRAMIPEXOLE, Rosuvastatin  SLEEP ARCHITECTURE The study was initiated at 10:13:45 PM and ended at 5:18:05 AM.  Sleep onset time was 60.0 minutes and the sleep efficiency was 45.4%. The total sleep time was 192.5 minutes.  Stage REM latency was 297.5 minutes.  The patient spent 63.64% of the night in stage N1 sleep, 36.10% in stage N2 sleep, 0.00% in stage N3 and 0.3% in REM.  Alpha intrusion was absent.  Supine sleep was 31.69%.  RESPIRATORY PARAMETERS The overall apnea/hypopnea index (AHI) was 48.9 per hour. The respiratory disturbance index was (RDI 80.4/h). There were 133 total apneas, including 133 obstructive, 0 central and 0 mixed apneas. There were 24 hypopneas and 101 RERAs.  The AHI during Stage REM sleep was 120.0 per hour.  AHI while supine was 82.6 per hour.  The mean oxygen saturation was 95.29%. The minimum SpO2 during sleep was 85.00%.  Moderate snoring was noted  during this study.  CARDIAC DATA The 2 lead EKG demonstrated sinus rhythm. The mean heart rate was 59.90 beats per minute. Other EKG findings include: None.  LEG MOVEMENT DATA The total PLMS were 0 with a resulting PLMS index of 0.00. Associated arousal with leg movement index was 0.0 .  IMPRESSIONS - Severe obstructive sleep apnea occurred during this study (AHI 48.9/h; RDI 80.4/h). Events were more severe with supine sleep (AHI 82.6/h) and during REM sleep (AHI 120/h). - Mild oxygen desaturation to a nadir of 85.00%. - The patient snored with moderate snoring volume. - Reduced sleep efficiency at 45.4%. - Abnormal sleep architecture with absent slow wave sleep and minimal REM sleep. - No cardiac abnormalities were noted during this study. - Clinically significant periodic limb movements did not occur during sleep. No significant associated arousals.  DIAGNOSIS - Obstructive Sleep Apnea (G47.33)  RECOMMENDATIONS - Therapeutic CPAP titration to determine optimal pressure required to alleviate sleep disordered breathing. - Effort should be made to optimize nasal and oropharyngeal patency. - Positional therapy avoiding supine position during sleep. - Avoid alcohol, sedatives and other CNS depressants that may worsen sleep apnea and disrupt normal sleep architecture. - Sleep hygiene should be reviewed to assess factors that may improve sleep quality. - Weight management (BMI 40) and regular exercise should be initiated or continued if appropriate.  [Electronically signed] 05/16/2021 09:56 AM  Shelva Majestic MD, [facility], [place], American Board of Sleep Medicine   NPI: [id number] [place] PH: [phone]   FX: [phone] [place]

## 2021-05-24 ENCOUNTER — Telehealth: Payer: Self-pay | Admitting: *Deleted

## 2021-05-24 NOTE — Telephone Encounter (Signed)
Left message to return a call to discuss sleep study results and recommendations. 

## 2021-05-24 NOTE — Telephone Encounter (Signed)
-----   Message from Troy Sine, MD sent at 05/16/2021 10:02 AM EDT ----- Mariann Laster, please notify pt and schedule for CPAP titration study.

## 2021-05-24 NOTE — Telephone Encounter (Signed)
Prior Authorization for CPAP titration sent to [facility] via web portal. Tracking Number [id number].

## 2021-05-24 NOTE — Telephone Encounter (Signed)
CPAP titration

## 2021-05-24 NOTE — Telephone Encounter (Signed)
-----   Message from Troy Sine, MD sent at 05/16/2021 10:02 AM EDT ----- Michael Fuller, please notify pt and schedule for CPAP titration study.

## 2021-06-01 NOTE — Telephone Encounter (Signed)
Received a denial for CPAP titration. Ordering provider will be notified for further instructions and/or orders.

## 2021-06-14 NOTE — Telephone Encounter (Signed)
MyChart message sent to patient informing him of his CPAP situation.

## 2021-06-14 NOTE — Telephone Encounter (Signed)
Per Dr Claiborne Billings VO order APAP with settings of Auto 7-20 CmH2O EPR-3. Order was sent to [place].

## 2021-06-23 DIAGNOSIS — R945 Abnormal results of liver function studies: Secondary | ICD-10-CM | POA: Diagnosis not present

## 2021-07-27 ENCOUNTER — Encounter: Payer: Self-pay | Admitting: Physician Assistant

## 2021-07-27 ENCOUNTER — Other Ambulatory Visit: Payer: Self-pay

## 2021-07-27 ENCOUNTER — Ambulatory Visit: Payer: Self-pay | Admitting: Physician Assistant

## 2021-07-27 VITALS — BP 164/106 | HR 78 | Temp 97.7°F | Resp 14 | Ht 74.0 in | Wt 300.0 lb

## 2021-07-27 DIAGNOSIS — M25571 Pain in right ankle and joints of right foot: Secondary | ICD-10-CM

## 2021-07-27 MED ORDER — INDOMETHACIN 50 MG PO CAPS: 50.0000 mg | ORAL_CAPSULE | Freq: Three times a day (TID) | ORAL | 1 refills | Status: DC

## 2021-07-27 NOTE — Progress Notes (Signed)
   Subjective: Left great toe pain    Patient ID: Michael Fuller, male    DOB: 04/22/1961, 60 y.o.   MRN: [id number]  HPI  Patient complain of left great toe pain for 5 days.  Initially pain was in the left knee 2 weeks ago and he treated with Voltaren gel.  States similar pain is now under great left toe.  No previous history of gout.  No family history of gout.  Patient rates the pain currently as a 4/10.  Patient describes the pain as "achy".  Pain increased with ambulation.  No other provocative incident for complaint.  Patient does admit to noncompliance hyperlipidemia hypertension medications.  Review of Systems Hyperlipidemia and hypertension.    Objective:   Physical Exam No acute distress.  Temperature 97.7, pulse 70, respiration 14, BP is 164/106.  Patient benefits 5% O2 sat on room air.  Patient weighs 200 pounds and BMI is 38.5. No obvious deformity to the left hallux.  No notable edema or erythema.  Patient has moderate guarding palpation plantar aspect of the first metatarsal.       Assessment & Plan: Left toe pain.   Patient presents with 4 days of left toe pain.  Differential consist of osteoarthritis or gout.  Patient will have uric acid and sed rate drawn today.  Patient given discharge care instruction prescription for indomethacin.  Patient advised return back in 4 days for lab results and reevaluation.

## 2021-07-28 LAB — URIC ACID: Uric Acid: 6.7 mg/dL (ref 3.8–8.4)

## 2021-07-28 LAB — SEDIMENTATION RATE: Sed Rate: 21 mm/hr (ref 0–30)

## 2021-08-01 ENCOUNTER — Encounter: Payer: Self-pay | Admitting: Physician Assistant

## 2021-08-01 ENCOUNTER — Ambulatory Visit
Admission: RE | Admit: 2021-08-01 | Discharge: 2021-08-01 | Disposition: A | Discharge status: Home / Self Care | Payer: 59 | Source: Ambulatory Visit | Attending: Physician Assistant | Admitting: Physician Assistant

## 2021-08-01 ENCOUNTER — Ambulatory Visit: Payer: Self-pay | Admitting: Physician Assistant

## 2021-08-01 ENCOUNTER — Other Ambulatory Visit: Payer: Self-pay

## 2021-08-01 ENCOUNTER — Ambulatory Visit
Admission: RE | Admit: 2021-08-01 | Discharge: 2021-08-01 | Disposition: A | Discharge status: Home / Self Care | Payer: 59 | Attending: Physician Assistant | Admitting: Physician Assistant

## 2021-08-01 VITALS — BP 154/99 | HR 95 | Temp 97.8°F | Resp 14 | Ht 74.0 in | Wt 305.0 lb

## 2021-08-01 DIAGNOSIS — M25562 Pain in left knee: Secondary | ICD-10-CM | POA: Insufficient documentation

## 2021-08-01 DIAGNOSIS — M25571 Pain in right ankle and joints of right foot: Secondary | ICD-10-CM | POA: Insufficient documentation

## 2021-08-01 DIAGNOSIS — M7732 Calcaneal spur, left foot: Secondary | ICD-10-CM | POA: Diagnosis not present

## 2021-08-01 DIAGNOSIS — M19072 Primary osteoarthritis, left ankle and foot: Secondary | ICD-10-CM | POA: Diagnosis not present

## 2021-08-01 DIAGNOSIS — M25462 Effusion, left knee: Secondary | ICD-10-CM | POA: Diagnosis not present

## 2021-08-01 NOTE — Progress Notes (Signed)
   Subjective: Left knee/foot pain    Patient ID: Michael Fuller, male    DOB: 02/01/1961, 60 y.o.   MRN: [id number]  HPI Patient returns for reevaluation of left foot pain.  Patient state only bone pain for 2 weeks.  Patient the pain initially started in the knee and got better with Voltaren cream.  Patient is same pain is in the left great toe.  There was a high suspicion for gout and patient had uric acid and sed rate drawn on his last visit.  Labs are unremarkable.  Patient state pain persist and is also affecting his left knee today.  Patient did not feel the prescription for indomethacin as directed.  Patient state he saw the lab results and thought the medication was for gout so he did not get it filled.  Review of Systems Hyperlipidemia.    Objective:   Physical Exam Patient appears in no acute distress.  Still ambulates with atypical gait.  No obvious deformity to the left knee and left foot.  Moderate crepitus with palpation of the anterior left knee.       Assessment & Plan: Left lower extremity pain.   Advised patient to have the prescription for anti-inflammatory medication filled today.  Patient will have images of left knee and foot.  Patient advised to follow-up in 2 days.

## 2021-08-02 ENCOUNTER — Ambulatory Visit: Payer: Self-pay | Admitting: Physician Assistant

## 2021-08-02 ENCOUNTER — Encounter: Payer: Self-pay | Admitting: Physician Assistant

## 2021-08-02 VITALS — BP 150/90 | HR 84 | Temp 97.7°F | Resp 14 | Ht 74.0 in | Wt 305.0 lb

## 2021-08-02 DIAGNOSIS — M25562 Pain in left knee: Secondary | ICD-10-CM

## 2021-08-02 DIAGNOSIS — G8929 Other chronic pain: Secondary | ICD-10-CM

## 2021-08-02 NOTE — Progress Notes (Signed)
   Subjective: Chronic knee pain    Patient ID: Michael Fuller, male    DOB: [birth year], 60 y.o.   MRN: [id number]  HPI Patient returns for reevaluation of chronic left knee pain status post x-rays.  Patient has not picked up anti-inflammatory medication as directed on his visit on 07/27/2021.  Continues to ambulate with atypical gait secondary to knee pain.  Patient also complaining of left foot pain which has improved.  Discussed negative labs that would be consistent with gout.   Review of Systems A. fib, hyperlipidemia, and hypertension.    Objective:   Physical Exam No acute distress.  Temperature 97.7, pulse 84, respiration 14, BP is 150/90 patient 94% O2 sat on room air.  Patient weighs 305 pounds and BMI is 39.16.  Examination was deferred today.  Discussed degenerative changes noticed in the left knee and foot x-ray.       Assessment & Plan: DJD  Patient advised to pick up anti-inflammatory medication take as directed.  Patient will consult orthopedic for definitive valuation treatment.

## 2021-08-02 NOTE — Progress Notes (Signed)
Pt knee pain is tolerable today./CL,RMA

## 2022-01-15 DIAGNOSIS — H524 Presbyopia: Secondary | ICD-10-CM | POA: Diagnosis not present

## 2022-01-15 DIAGNOSIS — H35033 Hypertensive retinopathy, bilateral: Secondary | ICD-10-CM | POA: Diagnosis not present

## 2022-03-01 IMAGING — DX DG CHEST 2V
2 series · 2 of 2 positions shown · non-contrast
Comparison: None

CLINICAL DATA: Shortness of breath for 5 days, low oxygen
saturation, tachycardia, cough

EXAM:
CHEST - 2 VIEW

[chest pa]
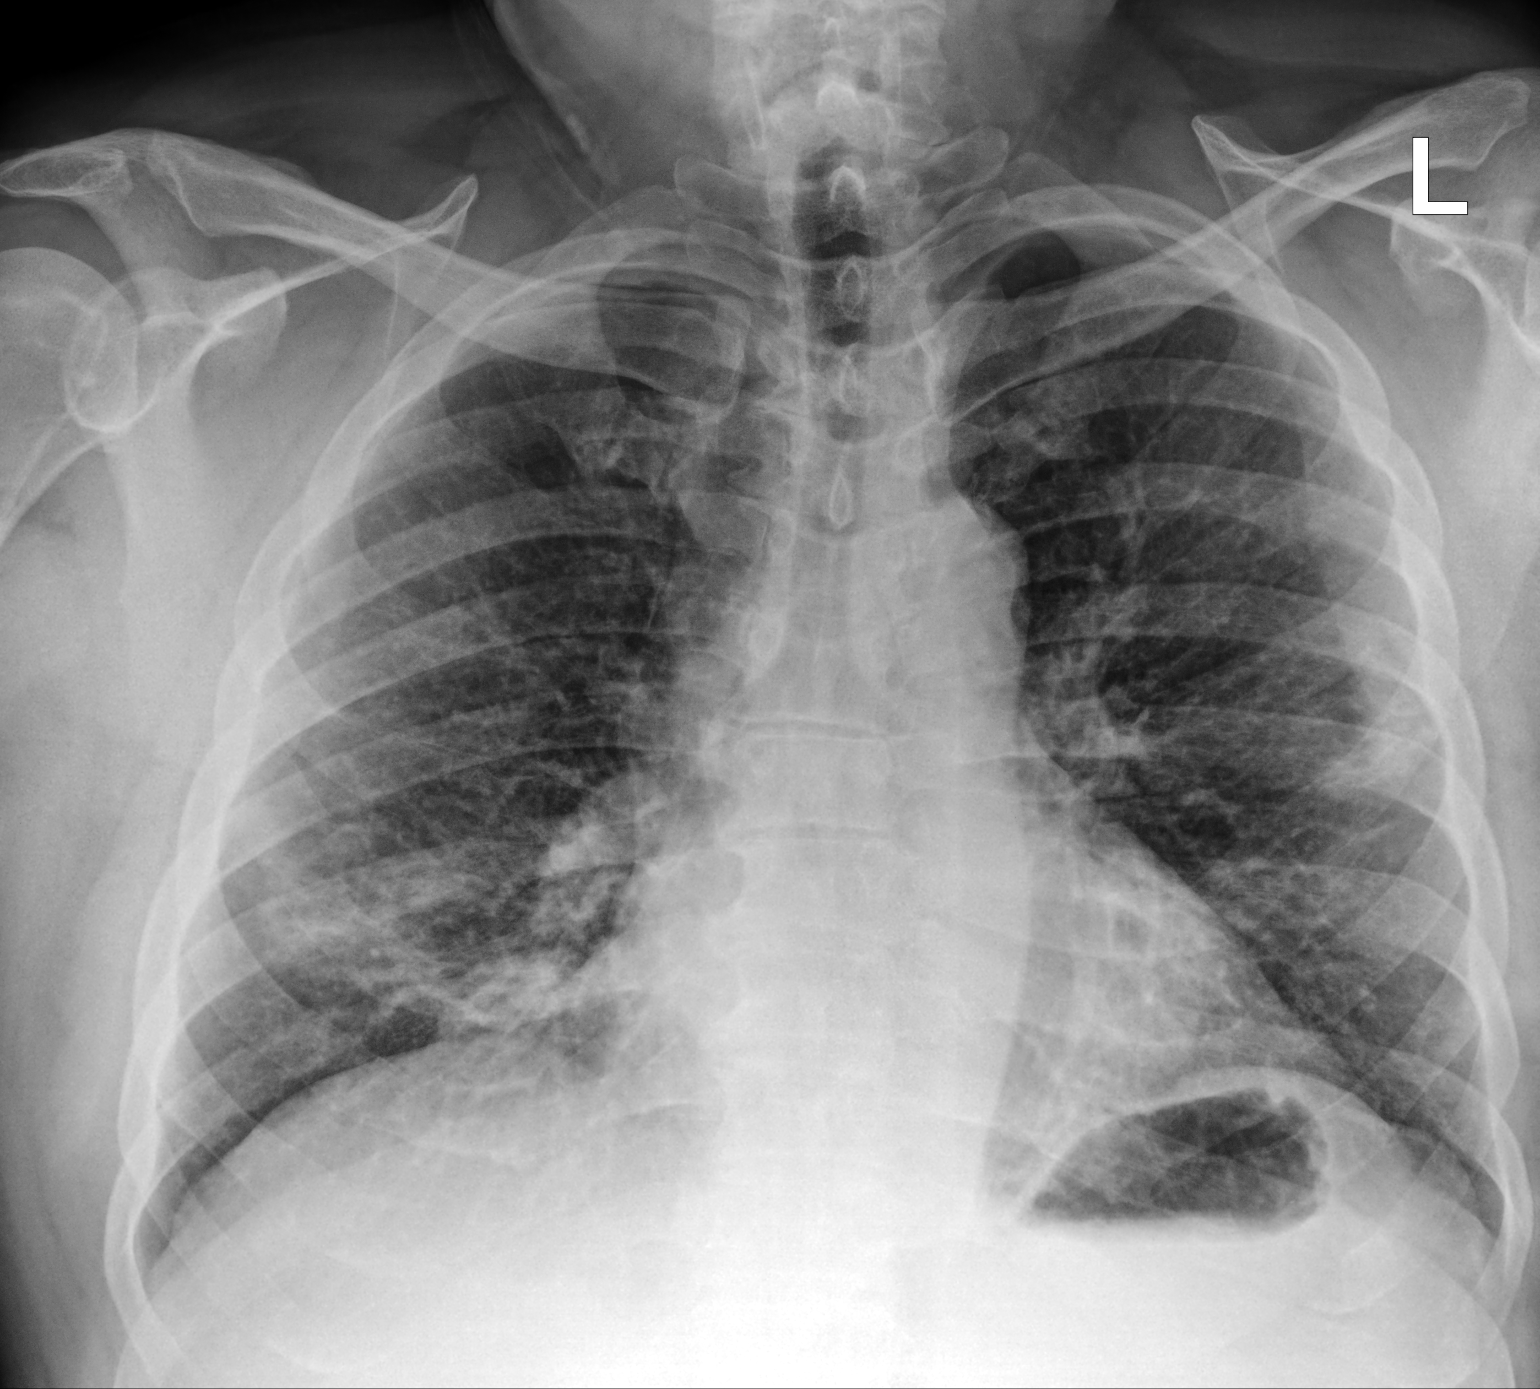

[chest lat]
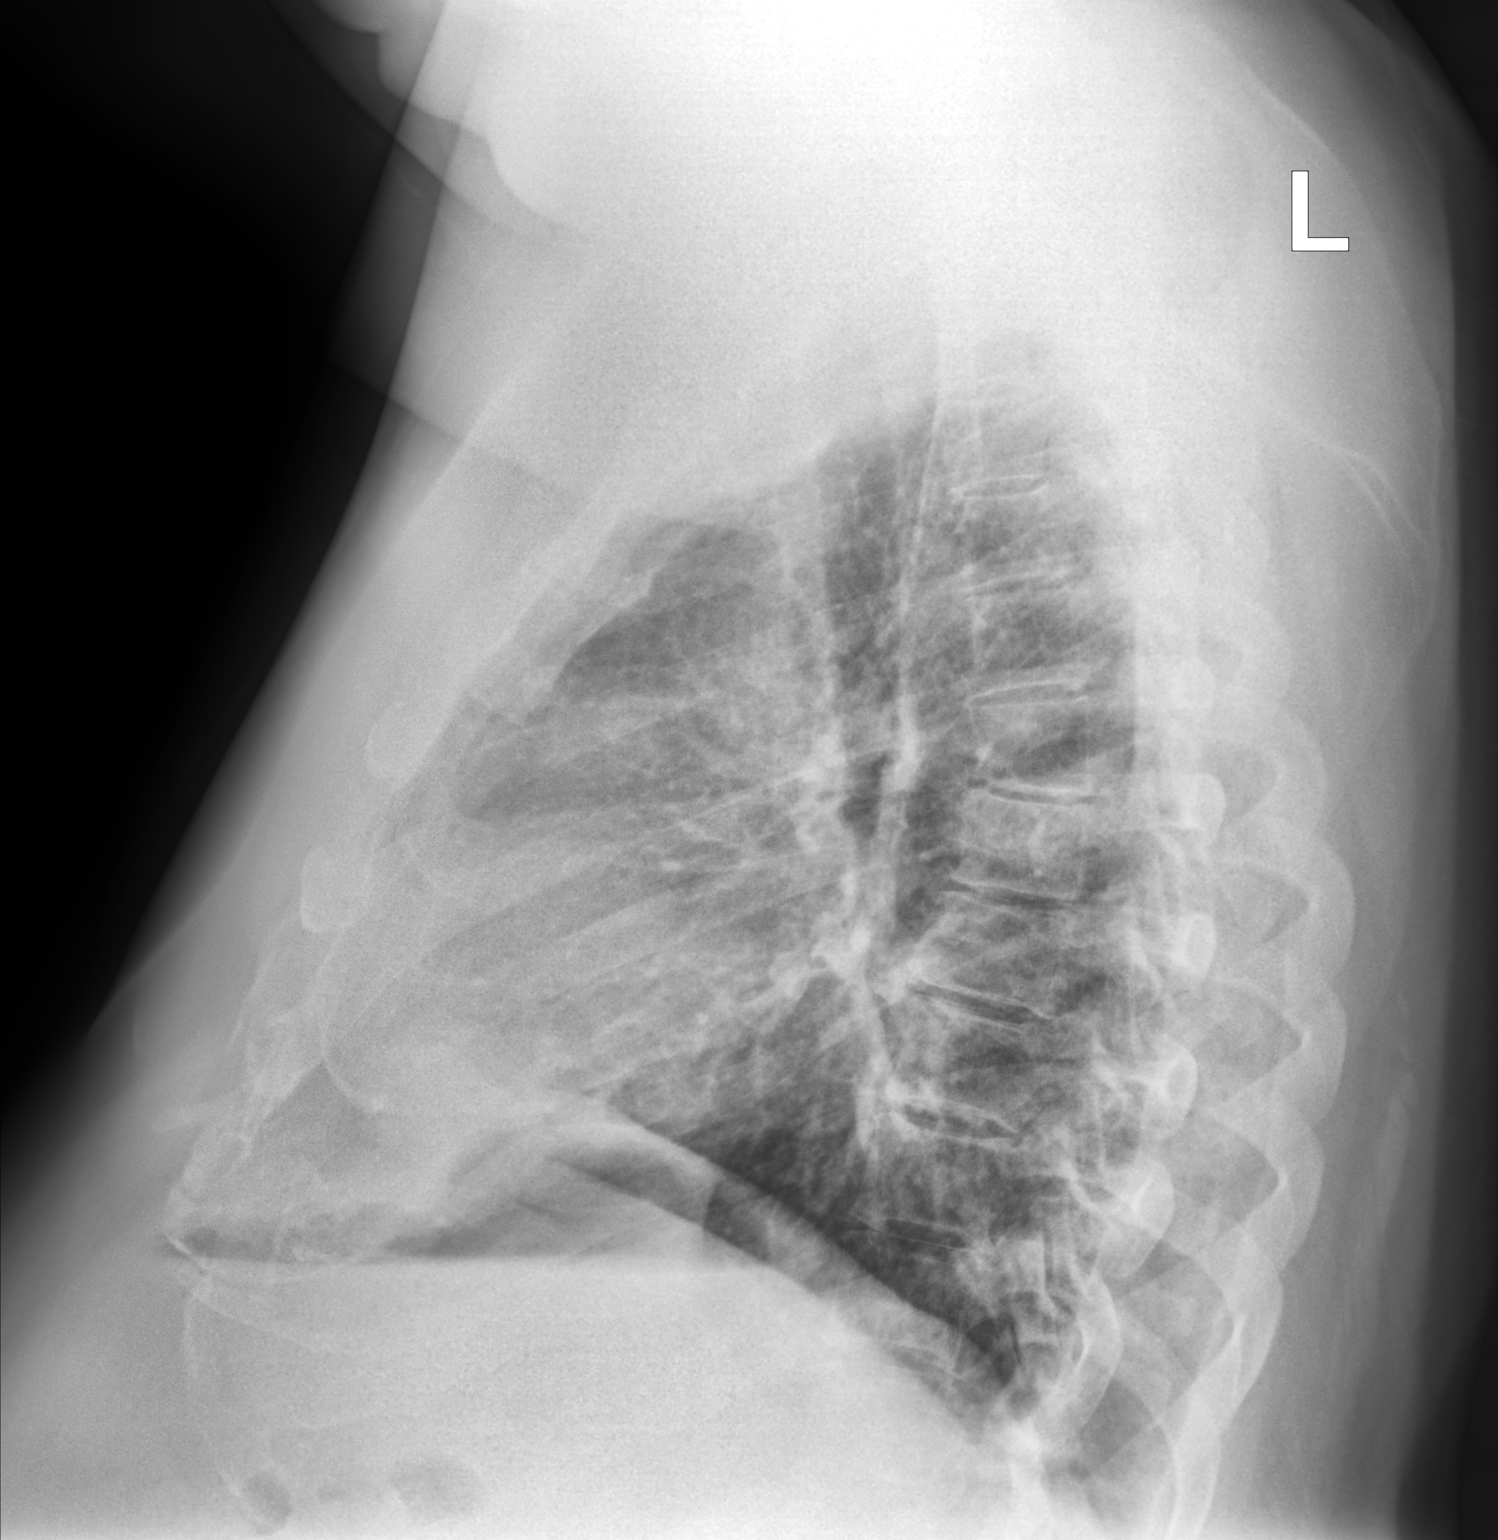

[2 of 2 positions shown; findings below may reference images not displayed]

FINDINGS: Normal heart size, mediastinal contours, and pulmonary vascularity.

Patchy BILATERAL pulmonary infiltrates consistent with multifocal
pneumonia, cannot exclude [id number] .

No pleural effusion or pneumothorax.

Osseous structures unremarkable.
IMPRESSION: Patchy BILATERAL pulmonary infiltrates consistent with multifocal
pneumonia, cannot exclude [id number] as etiology.

## 2022-03-21 DIAGNOSIS — E785 Hyperlipidemia, unspecified: Secondary | ICD-10-CM | POA: Diagnosis not present

## 2022-03-21 DIAGNOSIS — Z125 Encounter for screening for malignant neoplasm of prostate: Secondary | ICD-10-CM | POA: Diagnosis not present

## 2022-03-21 DIAGNOSIS — I10 Essential (primary) hypertension: Secondary | ICD-10-CM | POA: Diagnosis not present

## 2022-03-21 DIAGNOSIS — I4819 Other persistent atrial fibrillation: Secondary | ICD-10-CM | POA: Diagnosis not present

## 2022-03-21 DIAGNOSIS — R69 Illness, unspecified: Secondary | ICD-10-CM | POA: Diagnosis not present

## 2022-03-21 DIAGNOSIS — I2510 Atherosclerotic heart disease of native coronary artery without angina pectoris: Secondary | ICD-10-CM | POA: Diagnosis not present

## 2022-03-21 DIAGNOSIS — Z79899 Other long term (current) drug therapy: Secondary | ICD-10-CM | POA: Diagnosis not present

## 2022-03-21 DIAGNOSIS — R7303 Prediabetes: Secondary | ICD-10-CM | POA: Diagnosis not present

## 2022-03-30 DIAGNOSIS — I480 Paroxysmal atrial fibrillation: Secondary | ICD-10-CM | POA: Diagnosis not present

## 2022-03-30 DIAGNOSIS — I2510 Atherosclerotic heart disease of native coronary artery without angina pectoris: Secondary | ICD-10-CM | POA: Diagnosis not present

## 2022-03-30 DIAGNOSIS — E785 Hyperlipidemia, unspecified: Secondary | ICD-10-CM | POA: Diagnosis not present

## 2022-03-30 DIAGNOSIS — G4733 Obstructive sleep apnea (adult) (pediatric): Secondary | ICD-10-CM | POA: Diagnosis not present

## 2022-03-30 DIAGNOSIS — Z6841 Body Mass Index (BMI) 40.0 and over, adult: Secondary | ICD-10-CM | POA: Diagnosis not present

## 2022-03-30 DIAGNOSIS — R7401 Elevation of levels of liver transaminase levels: Secondary | ICD-10-CM | POA: Diagnosis not present

## 2022-03-30 DIAGNOSIS — I10 Essential (primary) hypertension: Secondary | ICD-10-CM | POA: Diagnosis not present

## 2022-05-10 DIAGNOSIS — E785 Hyperlipidemia, unspecified: Secondary | ICD-10-CM | POA: Diagnosis not present

## 2022-05-10 DIAGNOSIS — I2510 Atherosclerotic heart disease of native coronary artery without angina pectoris: Secondary | ICD-10-CM | POA: Diagnosis not present

## 2022-05-10 DIAGNOSIS — Z79899 Other long term (current) drug therapy: Secondary | ICD-10-CM | POA: Diagnosis not present

## 2022-05-10 DIAGNOSIS — R7303 Prediabetes: Secondary | ICD-10-CM | POA: Diagnosis not present

## 2022-05-10 DIAGNOSIS — I480 Paroxysmal atrial fibrillation: Secondary | ICD-10-CM | POA: Diagnosis not present

## 2022-05-15 DIAGNOSIS — I10 Essential (primary) hypertension: Secondary | ICD-10-CM | POA: Diagnosis not present

## 2022-05-15 DIAGNOSIS — K760 Fatty (change of) liver, not elsewhere classified: Secondary | ICD-10-CM | POA: Diagnosis not present

## 2022-05-15 DIAGNOSIS — E785 Hyperlipidemia, unspecified: Secondary | ICD-10-CM | POA: Diagnosis not present

## 2022-08-17 DIAGNOSIS — I10 Essential (primary) hypertension: Secondary | ICD-10-CM | POA: Diagnosis not present

## 2022-08-17 DIAGNOSIS — G4733 Obstructive sleep apnea (adult) (pediatric): Secondary | ICD-10-CM | POA: Diagnosis not present

## 2022-08-17 DIAGNOSIS — I480 Paroxysmal atrial fibrillation: Secondary | ICD-10-CM | POA: Diagnosis not present

## 2022-11-22 IMAGING — CR DG KNEE COMPLETE 4+V*L*
1 series · 4 of 4 positions shown · non-contrast
Comparison: None.

CLINICAL DATA: Increased knee pain

EXAM:
LEFT KNEE - COMPLETE 4+ VIEW

[Series 1: dg knee complete 4 views left · 0.14mm/px · 4 of 4 slices shown]
[im 1/4]
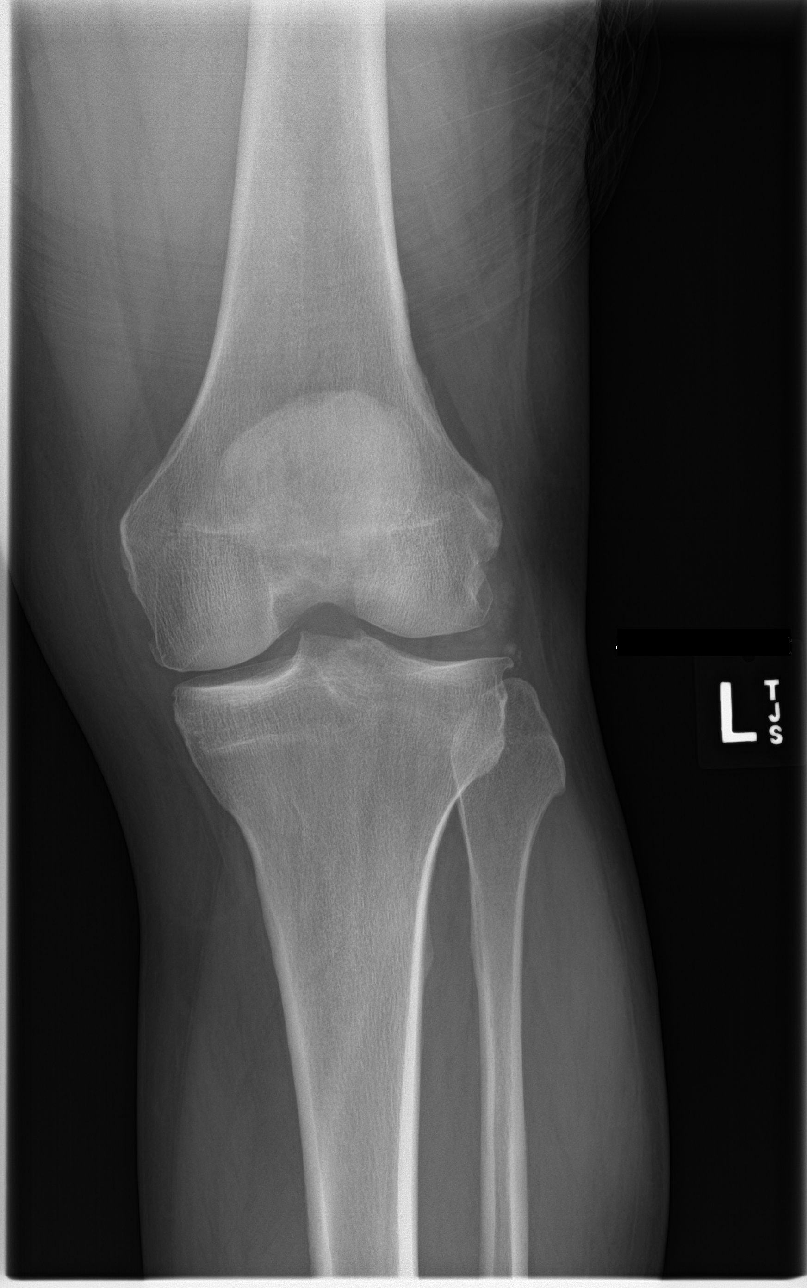
[im 2/4]
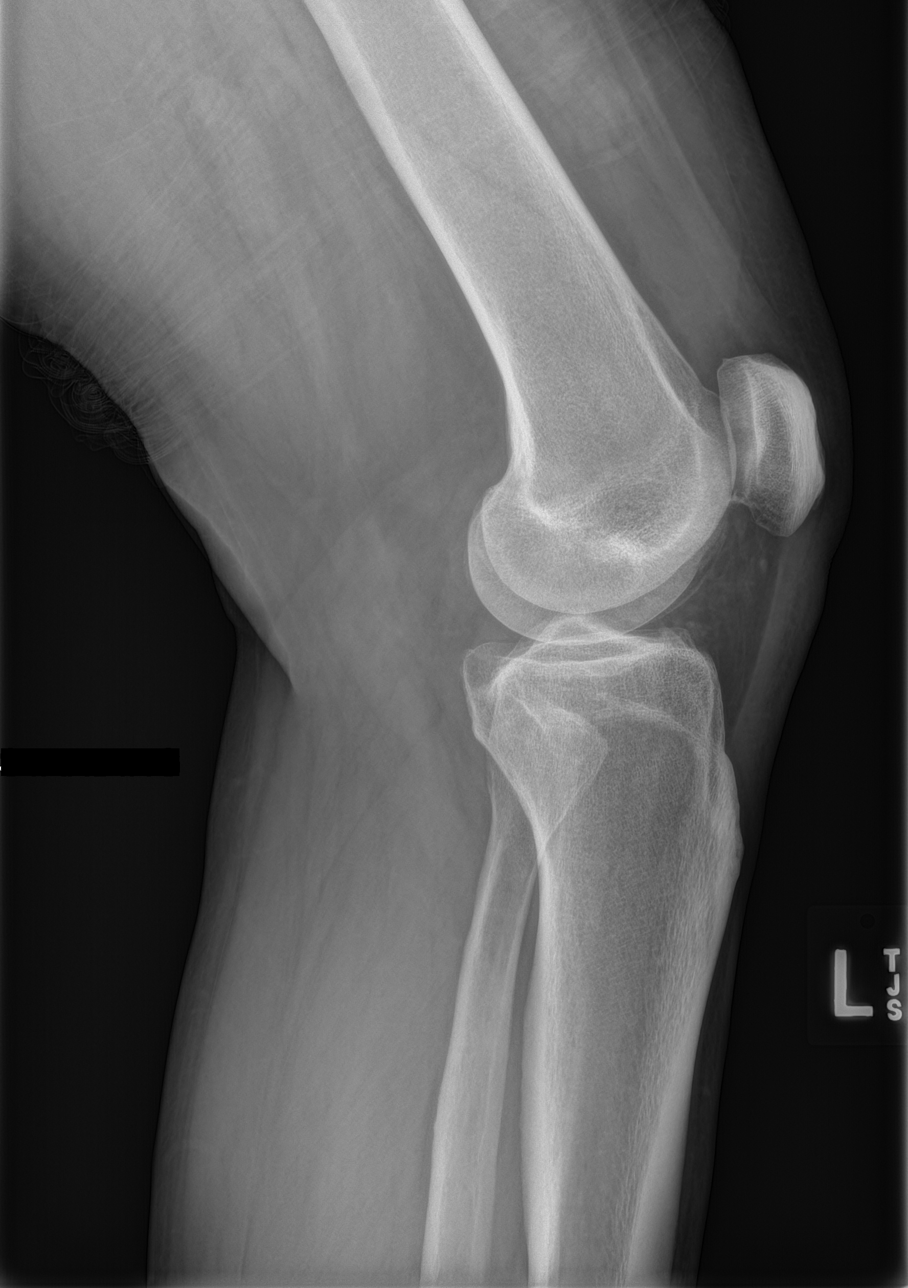
[im 3/4]
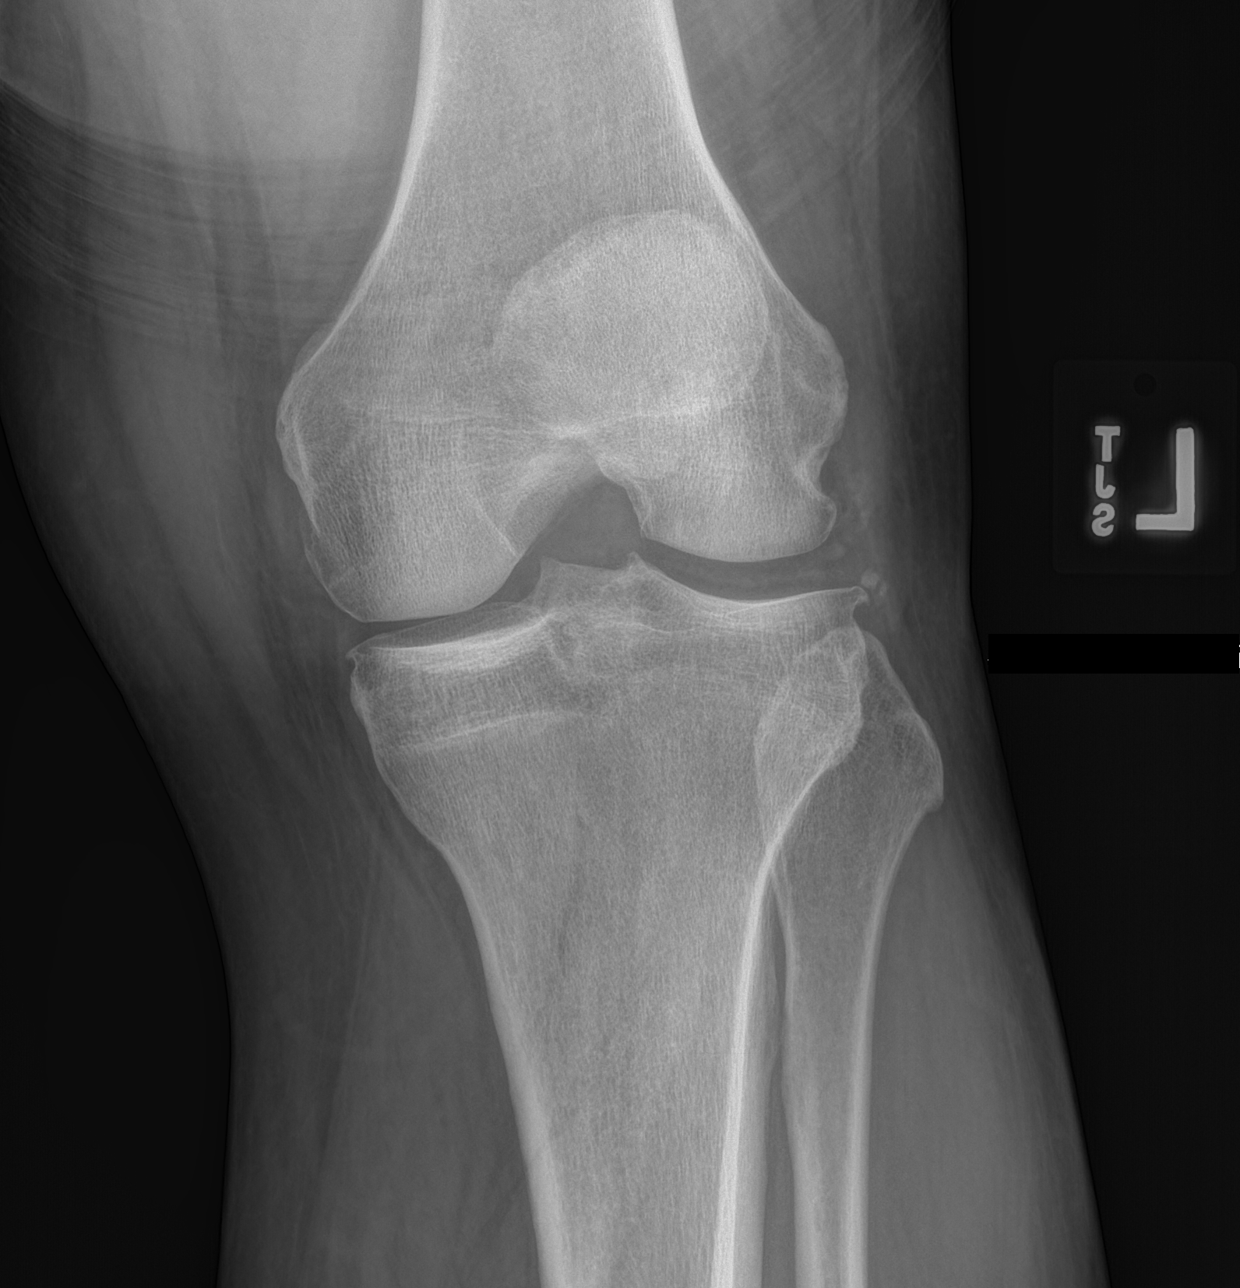
[im 4/4]
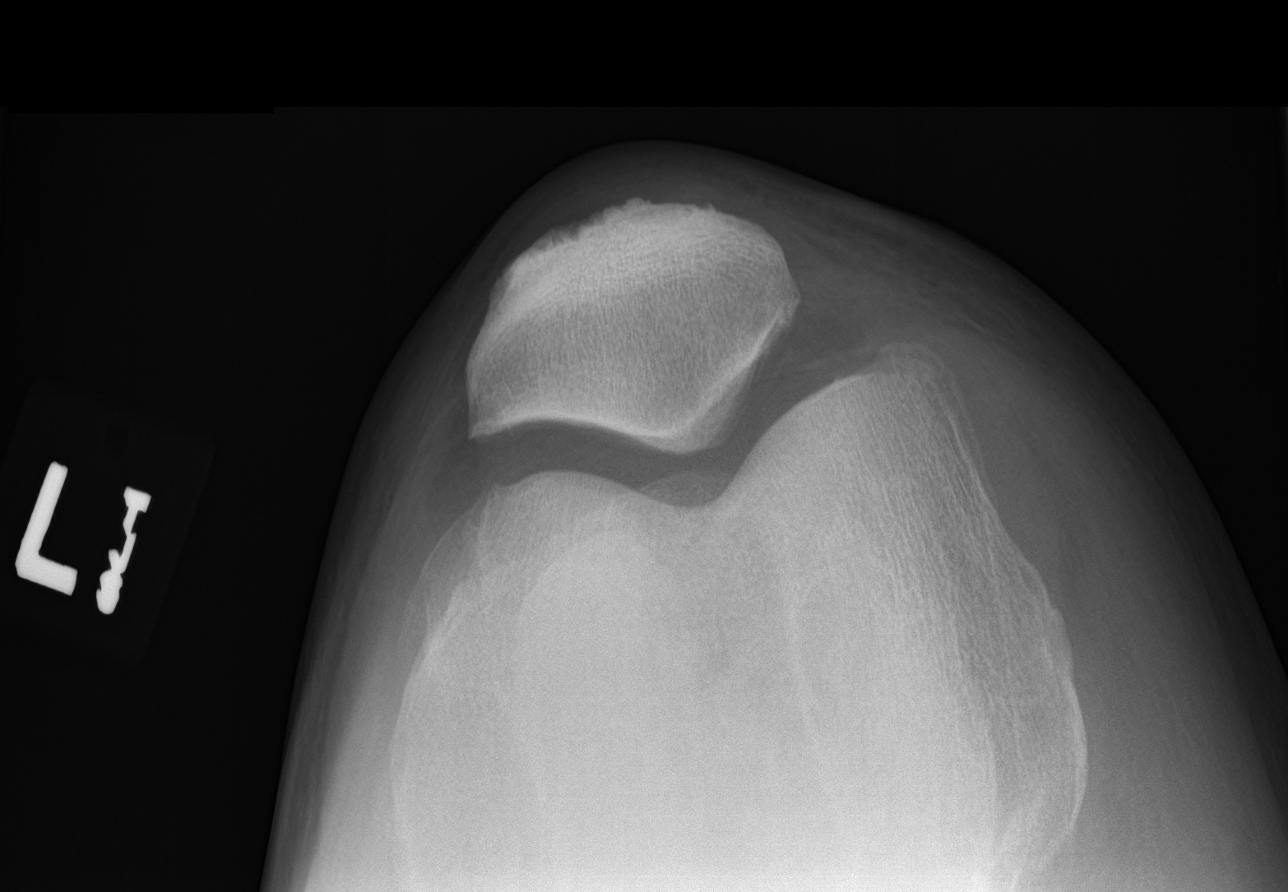

[4 of 4 positions shown; findings below may reference images not displayed]

FINDINGS: No fracture or malalignment. Mild tricompartment arthritis with
small knee effusion.
IMPRESSION: Mild tricompartment arthritis with small knee effusion

## 2022-11-26 DIAGNOSIS — I480 Paroxysmal atrial fibrillation: Secondary | ICD-10-CM | POA: Diagnosis not present

## 2022-11-26 DIAGNOSIS — G4733 Obstructive sleep apnea (adult) (pediatric): Secondary | ICD-10-CM | POA: Diagnosis not present

## 2023-01-02 ENCOUNTER — Telehealth: Payer: Self-pay

## 2023-01-02 NOTE — Telephone Encounter (Signed)
Lm for patient to ask if he is using cpap. If so, can he bring  SD card.

## 2023-01-03 ENCOUNTER — Encounter: Payer: Self-pay | Admitting: Internal Medicine

## 2023-01-03 ENCOUNTER — Ambulatory Visit (INDEPENDENT_AMBULATORY_CARE_PROVIDER_SITE_OTHER): Payer: 59 | Admitting: Internal Medicine

## 2023-01-03 VITALS — BP 140/78 | HR 84 | Temp 97.8°F | Ht 73.0 in | Wt 312.0 lb

## 2023-01-03 DIAGNOSIS — G4733 Obstructive sleep apnea (adult) (pediatric): Secondary | ICD-10-CM | POA: Diagnosis not present

## 2023-01-03 DIAGNOSIS — G4719 Other hypersomnia: Secondary | ICD-10-CM

## 2023-01-03 NOTE — Progress Notes (Signed)
Name: Michael Fuller MRN: [id number] DOB: 10/21/1961    CHIEF COMPLAINT:  EXCESSIVE DAYTIME SLEEPINESS   HISTORY OF PRESENT ILLNESS: Patient is seen today for problems and issues with sleep related to excessive daytime sleepiness Patient  has been having sleep problems for many years Patient has been having excessive daytime sleepiness for a long time Patient has been having extreme fatigue and tiredness, lack of energy +  very Loud snoring every night + struggling breathe at night and gasps for air + morning headaches + Nonrefreshing sleep  PATIENT HAD PSG JUNE 2023 AHI 41   Discussed sleep data and reviewed with patient.  Encouraged proper weight management.  Discussed driving precautions and its relationship with hypersomnolence.  Discussed operating dangerous equipment and its relationship with hypersomnolence.  Discussed sleep hygiene, and benefits of a fixed sleep waked time.  The importance of getting eight or more hours of sleep discussed with patient.  Discussed limiting the use of the computer and television before bedtime.  Decrease naps during the day, so night time sleep will become enhanced.  Limit caffeine, and sleep deprivation.  HTN, stroke, and heart failure are potential risk factors.    EPWORTH SLEEP SCORE 9    PAST MEDICAL HISTORY :   has a past medical history of Anxiety disorder, Complication of anesthesia, COVID-19 virus infection (11/08/2020), DJD (degenerative joint disease), lumbosacral, Hyperlipidemia due to dietary fat intake, Nephrolithiasis, Osteoarthritis involving multiple joints on both sides of body, Persistent atrial fibrillation ([place]) (10/2020), and RLS (restless legs syndrome).  has a past surgical history that includes Back surgery (1996); Shoulder surgery (Bilateral); Carpal tunnel release (Bilateral); Cardioversion (N/A, 01/12/2021); transthoracic echocardiogram (11/25/2020); LEFT HEART CATH AND CORONARY ANGIOGRAPHY (N/A,  02/10/2021); NM MYOVIEW LTD (01/11/2021); and Mass excision (Right, 04/26/2021). Prior to Admission medications   Medication Sig Start Date End Date Taking? Authorizing Provider  Carboxymethylcellul-Glycerin (CLEAR EYES FOR DRY EYES) 1-0.25 % SOLN Place 1 drop into both eyes daily as needed (Dry eyes).    [provider]  indomethacin (INDOCIN) 50 MG capsule Take 1 capsule (50 mg total) by mouth 3 (three) times daily with meals. 07/27/21   Sable Feil, PA-C  pramipexole (MIRAPEX) 1 MG tablet Take 1 mg by mouth daily.    [provider]  venlafaxine (EFFEXOR) 75 MG tablet Take 75 mg by mouth 2 (two) times daily. 12/23/20   [provider]   No Known Allergies  FAMILY HISTORY:  family history includes Diabetes Mellitus II in his paternal grandfather; Healthy in his brother and sister; Hypertension in his father and maternal grandmother; Hypothyroidism in his mother. SOCIAL HISTORY:  reports that he has never smoked. He has never used smokeless tobacco. He reports current alcohol use of about 4.0 standard drinks of alcohol per week. He reports that he does not use drugs.   Review of Systems:  Gen:  Denies  fever, sweats, chills weight loss  HEENT: Denies blurred vision, double vision, ear pain, eye pain, hearing loss, nose bleeds, sore throat Cardiac:  No dizziness, chest pain or heaviness, chest tightness,edema, No JVD Resp:   No cough, -sputum production, -shortness of breath,-wheezing, -hemoptysis,  Gi: Denies swallowing difficulty, stomach pain, nausea or vomiting, diarrhea, constipation, bowel incontinence Gu:  Denies bladder incontinence, burning urine Ext:   Denies Joint pain, stiffness or swelling Skin: Denies  skin rash, easy bruising or bleeding or hives Endoc:  Denies polyuria, polydipsia , polyphagia or weight change Psych:   Denies depression, insomnia or hallucinations  Other:  All other systems negative   ALL OTHER ROS ARE NEGATIVE   BP (!) 140/78  (BP Location: Left Arm, Cuff Size: Large)   Pulse 84   Temp 97.8 F (36.6 C) (Temporal)   Ht 6' 1"$  (1.854 m)   Wt (!) 312 lb (141.5 kg)   SpO2 96%   BMI 41.16 kg/m     Physical Examination:   General Appearance: No distress  EYES PERRLA, EOM intact.   NECK Supple, No JVD Pulmonary: normal breath sounds, No wheezing.  CardiovascularNormal S1,S2.  No m/r/g.   Abdomen: Benign, Soft, non-tender. Skin:   warm, no rashes, no ecchymosis  Extremities: normal, no cyanosis, clubbing. Neuro:without focal findings,  speech normal  PSYCHIATRIC: Mood, affect within normal limits.   ALL OTHER ROS ARE NEGATIVE    ASSESSMENT AND PLAN SYNOPSIS  Patient with signs and symptoms of excessive daytime sleepiness with SEVERE diagnosis of obstructive sleep apnea in the setting of obesity and deconditioned state   START OSA THERAPY WITH AUTOCPAP 5-12  Obesity -recommend significant weight loss -recommend changing diet  Deconditioned state -Recommend increased daily activity and exercise   MEDICATION ADJUSTMENTS/LABS AND TESTS ORDERED: Recommend Starting AUTO CPAP 5-12 cm h20  Recommend weight loss    CURRENT MEDICATIONS REVIEWED AT LENGTH WITH PATIENT TODAY   Patient  satisfied with Plan of action and management. All questions answered  Follow up  3 months  Total Time Spent  35 mins   Maretta Bees Patricia Pesa, M.D.  [facility]  Medical Director [place] Director [facility] Cardio-Pulmonary Department

## 2023-01-03 NOTE — Telephone Encounter (Signed)
ATC patient x2. Call was answered then ended. Will close encounter per office protocol.

## 2023-01-03 NOTE — Patient Instructions (Addendum)
  Recommend AUTOCPAP 5-12 cm h20  Avoid secondhand smoke Avoid SICK contacts Recommend  Masking  when appropriate Recommend Keep up-to-date with vaccinations

## 2023-01-07 DIAGNOSIS — M25561 Pain in right knee: Secondary | ICD-10-CM | POA: Insufficient documentation

## 2023-01-07 DIAGNOSIS — M25562 Pain in left knee: Secondary | ICD-10-CM | POA: Insufficient documentation

## 2023-01-08 DIAGNOSIS — M170 Bilateral primary osteoarthritis of knee: Secondary | ICD-10-CM | POA: Diagnosis not present

## 2023-01-16 DIAGNOSIS — G4733 Obstructive sleep apnea (adult) (pediatric): Secondary | ICD-10-CM | POA: Diagnosis not present

## 2023-01-17 NOTE — Progress Notes (Signed)
Cardiology Office Note:    Date:  01/28/2023   ID:  [name], DOB 1961/04/06, MRN [id number]  PCP:  Asencion Noble, [facility] Cardiologist:  Glenetta Hew, MD     Referring MD: Asencion Noble, MD   Chief Complaint:  Follow-up and Atrial Fibrillation     History of Present Illness:   RODRIGUS NAFUS is a 62 y.o. male with  history of persistent afib DCCV 12/2020 to NSR previously  on xarelto, reversible defect on stress test 2022,  Cardiac Cath 02/10/2021: Ostial proximal RCA 30%.  Mid to distal LAD 30% distal LAD tapers to small caliber vessel but does not reach the apex.  Severely elevated LVEDP of 30 mmHg. ==> false-positive stress test, but diastolic heart failure, HTN,HLD, morbid obesity, DOE.   Patient comes in for f/u. Was diagnosed with sleep apnea and trying to get used to CPAP but not tolerating yet. Hasn't felt any Afib and in NSR today. Denies chest pain, dyspnea, edema, palpitation. He stopped eliquis on his own and Dr. Willey Blade advised him to come here for restart. No regular exercise because of knee issues.      Past Medical History:  Diagnosis Date   Anxiety disorder    Complication of anesthesia    Nausae   COVID-19 virus infection 11/08/2020   Cough and congestion for several weeks with 4 to 5 days worsening.   DJD (degenerative joint disease), lumbosacral    Hyperlipidemia due to dietary fat intake    Nephrolithiasis    Osteoarthritis involving multiple joints on both sides of body    Persistent atrial fibrillation ([place]) 10/2020   Initial diagnosis with the setting of COVID-19 infection   RLS (restless legs syndrome)    Current Medications: Current Meds  Medication Sig   apixaban (ELIQUIS) 5 MG TABS tablet Take 1 tablet (5 mg total) by mouth 2 (two) times daily.   Carboxymethylcellul-Glycerin (CLEAR EYES FOR DRY EYES) 1-0.25 % SOLN Place 1 drop into both eyes daily as needed (Dry eyes).   diltiazem (CARDIZEM CD) 180 MG 24 hr capsule  Take 180 mg by mouth daily.   pramipexole (MIRAPEX) 1 MG tablet Take 1 mg by mouth daily.   rosuvastatin (CRESTOR) 20 MG tablet Take 20 mg by mouth at bedtime.   venlafaxine (EFFEXOR) 75 MG tablet Take 75 mg by mouth 2 (two) times daily.    Allergies:   Patient has no known allergies.   Social History   Tobacco Use   Smoking status: Never   Smokeless tobacco: Never  Substance Use Topics   Alcohol use: Yes    Alcohol/week: 4.0 standard drinks of alcohol    Types: 4 Cans of beer per week   Drug use: Never    Family Hx: The patient's family history includes Diabetes Mellitus II in his paternal grandfather; Healthy in his brother and sister; Hypertension in his father and maternal grandmother; Hypothyroidism in his mother.  ROS     Physical Exam:    VS:  BP (!) 140/90   Pulse 65   Ht '6\' 1"'$  (1.854 m)   Wt (!) 316 lb (143.3 kg)   BMI 41.69 kg/m     Wt Readings from Last 3 Encounters:  01/28/23 (!) 316 lb (143.3 kg)  01/03/23 (!) 312 lb (141.5 kg)  08/02/21 (!) 305 lb (138.3 kg)    Physical Exam  GEN: Obese, in no acute distress  Neck: no JVD, carotid bruits, or masses Cardiac:RRR; no  murmurs, rubs, or gallops  Respiratory:  clear to auscultation bilaterally, normal work of breathing GI: soft, nontender, nondistended, + BS Ext: without cyanosis, clubbing, or edema, Good distal pulses bilaterally Neuro:  Alert and Oriented x 3,  Psych: euthymic mood, full affect        EKGs/Labs/Other Test Reviewed:    EKG:  EKG is   ordered today.  The ekg ordered today demonstrates NSR with lateral TWI  Recent Labs: No results found for requested labs within last 365 days.   Recent Lipid Panel No results for input(s): "CHOL", "TRIG", "HDL", "VLDL", "LDLCALC", "LDLDIRECT" in the last 8760 hours.   Prior CV Studies:    Cardiac Cath 02/10/2021: Ostial proximal RCA 30%.  Mid to distal LAD 30% distal LAD tapers to small caliber vessel but does not reach the apex.  Severely elevated  LVEDP of 30 mmHg. ==> false-positive stress test, but diastolic heart failure..   Risk Assessment/Calculations/Metrics:    CHA2DS2-VASc Score = 3   This indicates a 3.2% annual risk of stroke. The patient's score is based upon: CHF History: 1 HTN History: 1 Diabetes History: 0 Stroke History: 0 Vascular Disease History: 1 Age Score: 0 Gender Score: 0        HYPERTENSION CONTROL Vitals:   01/28/23 1303 01/28/23 1427  BP: (!) 140/86 (!) 140/90    The patient's blood pressure is elevated above target today.  In order to address the patient's elevated BP: Blood pressure will be monitored at home to determine if medication changes need to be made.       ASSESSMENT & PLAN:   No problem-specific Assessment & Plan notes found for this encounter.   Persistent Afib previously on Xarelto per records but he says it was eliquis. He stopped in 2 yrs ago because he takes 2 ASA to sleep. Discussed risk of stroke and he's willing to restart eliquis 5 mg bid. Stop ASA. Check bmet and CBC today  Nonobstructive CAD-no angina  Chronic diastolic CHF-no CHF on exam  HTN-BP up today-recommend monitor at home and 2 gm sodium diet/weight loss.  HLD on crestor, LDL 94 04/2022  Morbid Obesity-exercise and weight loss recommended  OSA-trying to get adjusted to CPAP            Dispo:  No follow-ups on file.   Medication Adjustments/Labs and Tests Ordered: Current medicines are reviewed at length with the patient today.  Concerns regarding medicines are outlined above.  Tests Ordered: Orders Placed This Encounter  Procedures   Basic metabolic panel   CBC   EKG 12-Lead   Medication Changes: Meds ordered this encounter  Medications   apixaban (ELIQUIS) 5 MG TABS tablet    Sig: Take 1 tablet (5 mg total) by mouth 2 (two) times daily.    Dispense:  60 tablet    Refill:  [address] Ermalinda Barrios, PA-C  01/28/2023 2:28 PM    [facility] [facility], [place], [place]   [address] Phone: [phone]; Fax: [phone]

## 2023-01-28 ENCOUNTER — Encounter: Payer: Self-pay | Admitting: Physician Assistant

## 2023-01-28 ENCOUNTER — Ambulatory Visit: Payer: 59 | Attending: Physician Assistant | Admitting: Physician Assistant

## 2023-01-28 VITALS — BP 140/90 | HR 65 | Ht 73.0 in | Wt 316.0 lb

## 2023-01-28 DIAGNOSIS — I5032 Chronic diastolic (congestive) heart failure: Secondary | ICD-10-CM | POA: Diagnosis not present

## 2023-01-28 DIAGNOSIS — G4733 Obstructive sleep apnea (adult) (pediatric): Secondary | ICD-10-CM

## 2023-01-28 DIAGNOSIS — I4819 Other persistent atrial fibrillation: Secondary | ICD-10-CM | POA: Diagnosis not present

## 2023-01-28 DIAGNOSIS — E7849 Other hyperlipidemia: Secondary | ICD-10-CM | POA: Diagnosis not present

## 2023-01-28 DIAGNOSIS — I10 Essential (primary) hypertension: Secondary | ICD-10-CM

## 2023-01-28 DIAGNOSIS — I2510 Atherosclerotic heart disease of native coronary artery without angina pectoris: Secondary | ICD-10-CM | POA: Diagnosis not present

## 2023-01-28 MED ORDER — APIXABAN 5 MG PO TABS: 5.0000 mg | ORAL_TABLET | Freq: Two times a day (BID) | ORAL | 11 refills | Status: AC

## 2023-01-28 NOTE — Patient Instructions (Signed)
Medication Instructions:  Your physician has recommended you make the following change in your medication:   Start Eliquis 5 mg Two Times Daily   *If you need a refill on your cardiac medications before your next appointment, please call your pharmacy*   Lab Work: Your physician recommends that you return for lab work today.   If you have labs (blood work) drawn today and your tests are completely normal, you will receive your results only by: [place] (if you have MyChart) OR A paper copy in the mail If you have any lab test that is abnormal or we need to change your treatment, we will call you to review the results.   Testing/Procedures: NONE    Follow-Up: At [facility], you and your health needs are our priority.  As part of our continuing mission to provide you with exceptional heart care, we have created designated Provider Care Teams.  These Care Teams include your primary Cardiologist (physician) and Advanced Practice Providers (APPs -  Physician Assistants and Nurse Practitioners) who all work together to provide you with the care you need, when you need it.  We recommend signing up for the patient portal called "MyChart".  Sign up information is provided on this After Visit Summary.  MyChart is used to connect with patients for Virtual Visits (Telemedicine).  Patients are able to view lab/test results, encounter notes, upcoming appointments, etc.  Non-urgent messages can be sent to your provider as well.   To learn more about what you can do with MyChart, go to [email or web address].    Your next appointment:   6 month(s)  Provider:   Claudina Lick, MD    Other Instructions Thank you for choosing [facility]!  Two Gram Sodium Diet 2000 mg  What is Sodium? Sodium is a mineral found naturally in many foods. The most significant source of sodium in the diet is table salt, which is about 40% sodium.  Processed, convenience, and preserved  foods also contain a large amount of sodium.  The body needs only 500 mg of sodium daily to function,  A normal diet provides more than enough sodium even if you do not use salt.  Why Limit Sodium? A build up of sodium in the body can cause thirst, increased blood pressure, shortness of breath, and water retention.  Decreasing sodium in the diet can reduce edema and risk of heart attack or stroke associated with high blood pressure.  Keep in mind that there are many other factors involved in these health problems.  Heredity, obesity, lack of exercise, cigarette smoking, stress and what you eat all play a role.  General Guidelines: Do not add salt at the table or in cooking.  One teaspoon of salt contains over 2 grams of sodium. Read food labels Avoid processed and convenience foods Ask your dietitian before eating any foods not dicussed in the menu planning guidelines Consult your physician if you wish to use a salt substitute or a sodium containing medication such as antacids.  Limit milk and milk products to 16 oz (2 cups) per day.  Shopping Hints: READ LABELS!! "Dietetic" does not necessarily mean low sodium. Salt and other sodium ingredients are often added to foods during processing.    Menu Planning Guidelines Food Group Choose More Often Avoid  Beverages (see also the milk group All fruit juices, low-sodium, salt-free vegetables juices, low-sodium carbonated beverages Regular vegetable or tomato juices, commercially softened water used for drinking or cooking  Breads and  Cereals Enriched white, wheat, rye and pumpernickel bread, hard rolls and dinner rolls; muffins, cornbread and waffles; most dry cereals, cooked cereal without added salt; unsalted crackers and breadsticks; low sodium or homemade bread crumbs Bread, rolls and crackers with salted tops; quick breads; instant hot cereals; pancakes; commercial bread stuffing; self-rising flower and biscuit mixes; regular bread crumbs or  cracker crumbs  Desserts and Sweets Desserts and sweets mad with mild should be within allowance Instant pudding mixes and cake mixes  Fats Butter or margarine; vegetable oils; unsalted salad dressings, regular salad dressings limited to 1 Tbs; light, sour and heavy cream Regular salad dressings containing bacon fat, bacon bits, and salt pork; snack dips made with instant soup mixes or processed cheese; salted nuts  Fruits Most fresh, frozen and canned fruits Fruits processed with salt or sodium-containing ingredient (some dried fruits are processed with sodium sulfites        Vegetables Fresh, frozen vegetables and low- sodium canned vegetables Regular canned vegetables, sauerkraut, pickled vegetables, and others prepared in brine; frozen vegetables in sauces; vegetables seasoned with ham, bacon or salt pork  Condiments, Sauces, Miscellaneous  Salt substitute with physician's approval; pepper, herbs, spices; vinegar, lemon or lime juice; hot pepper sauce; garlic powder, onion powder, low sodium soy sauce (1 Tbs.); low sodium condiments (ketchup, chili sauce, mustard) in limited amounts (1 tsp.) fresh ground horseradish; unsalted tortilla chips, pretzels, potato chips, popcorn, salsa (1/4 cup) Any seasoning made with salt including garlic salt, celery salt, onion salt, and seasoned salt; sea salt, rock salt, kosher salt; meat tenderizers; monosodium glutamate; mustard, regular soy sauce, barbecue, sauce, chili sauce, teriyaki sauce, steak sauce, Worcestershire sauce, and most flavored vinegars; canned gravy and mixes; regular condiments; salted snack foods, olives, picles, relish, horseradish sauce, catsup   Food preparation: Try these seasonings Meats:    Pork Sage, onion Serve with applesauce  Chicken Poultry seasoning, thyme, parsley Serve with cranberry sauce  Lamb Curry powder, rosemary, garlic, thyme Serve with mint sauce or jelly  Veal Marjoram, basil Serve with current jelly, cranberry  sauce  Beef Pepper, bay leaf Serve with dry mustard, unsalted chive butter  Fish [place] leaf, dill Serve with unsalted lemon butter, unsalted parsley butter  Vegetables:    Asparagus Lemon juice   Broccoli Lemon juice   Carrots Mustard dressing parsley, mint, nutmeg, glazed with unsalted butter and sugar   Green beans Marjoram, lemon juice, nutmeg,dill seed   Tomatoes Basil, marjoram, onion   Spice /blend for [employer]" 4 tsp ground thyme 1 tsp ground sage 3 tsp ground rosemary 4 tsp ground marjoram   Test your knowledge A product that says "Salt Free" may still contain sodium. True or False Garlic Powder and Hot Pepper Sauce an be used as alternative seasonings.True or False Processed foods have more sodium than fresh foods.  True or False Canned Vegetables have less sodium than froze True or False   WAYS TO DECREASE YOUR SODIUM INTAKE Avoid the use of added salt in cooking and at the table.  Table salt (and other prepared seasonings which contain salt) is probably one of the greatest sources of sodium in the diet.  Unsalted foods can gain flavor from the sweet, sour, and butter taste sensations of herbs and spices.  Instead of using salt for seasoning, try the following seasonings with the foods listed.  Remember: how you use them to enhance natural food flavors is limited only by your creativity... Allspice-Meat, fish, eggs, fruit, peas, red and yellow vegetables  Almond Extract-Fruit baked goods Anise Seed-Sweet breads, fruit, carrots, beets, cottage cheese, cookies (tastes like licorice) Basil-Meat, fish, eggs, vegetables, rice, vegetables salads, soups, sauces Bay Leaf-Meat, fish, stews, poultry Burnet-Salad, vegetables (cucumber-like flavor) Caraway Seed-Bread, cookies, cottage cheese, meat, vegetables, cheese, rice Cardamon-Baked goods, fruit, soups Celery Powder or seed-Salads, salad dressings, sauces, meatloaf, soup, bread.Do not use  celery salt Chervil-Meats, salads, fish,  eggs, vegetables, cottage cheese (parsley-like flavor) Chili Power-Meatloaf, chicken cheese, corn, eggplant, egg dishes Chives-Salads cottage cheese, egg dishes, soups, vegetables, sauces Cilantro-Salsa, casseroles Cinnamon-Baked goods, fruit, pork, lamb, chicken, carrots Cloves-Fruit, baked goods, fish, pot roast, green beans, beets, carrots Coriander-Pastry, cookies, meat, salads, cheese (lemon-orange flavor) Cumin-Meatloaf, fish,cheese, eggs, cabbage,fruit pie (caraway flavor) [employer], fruit, eggs, fish, poultry, cottage cheese, vegetables Dill Seed-Meat, cottage cheese, poultry, vegetables, fish, salads, bread Fennel Seed-Bread, cookies, apples, pork, eggs, fish, beets, cabbage, cheese, Licorice-like flavor Garlic-(buds or powder) Salads, meat, poultry, fish, bread, butter, vegetables, potatoes.Do not  use garlic salt Ginger-Fruit, vegetables, baked goods, meat, fish, poultry Horseradish Root-Meet, vegetables, butter Lemon Juice or Extract-Vegetables, fruit, tea, baked goods, fish salads Mace-Baked goods fruit, vegetables, fish, poultry (taste like nutmeg) Maple Extract-Syrups Marjoram-Meat, chicken, fish, vegetables, breads, green salads (taste like Sage) Mint-Tea, lamb, sherbet, vegetables, desserts, carrots, cabbage Mustard, Dry or Seed-Cheese, eggs, meats, vegetables, poultry Nutmeg-Baked goods, fruit, chicken, eggs, vegetables, desserts Onion Powder-Meat, fish, poultry, vegetables, cheese, eggs, bread, rice salads (Do not use   Onion salt) Orange Extract-Desserts, baked goods Oregano-Pasta, eggs, cheese, onions, pork, lamb, fish, chicken, vegetables, green salads Paprika-Meat, fish, poultry, eggs, cheese, vegetables Parsley Flakes-Butter, vegetables, meat fish, poultry, eggs, bread, salads (certain forms may   Contain sodium Pepper-Meat fish, poultry, vegetables, eggs Peppermint Extract-Desserts, baked goods Poppy Seed-Eggs, bread, cheese, fruit dressings, baked  goods, noodles, vegetables, cottage  [employer], poultry, meat, fish, cauliflower, turnips,eggs bread Saffron-Rice, bread, veal, chicken, fish, eggs Sage-Meat, fish, poultry, onions, eggplant, tomateos, pork, stews Savory-Eggs, salads, poultry, meat, rice, vegetables, soups, pork Tarragon-Meat, poultry, fish, eggs, butter, vegetables (licorice-like flavor)  Thyme-Meat, poultry, fish, eggs, vegetables, (clover-like flavor), sauces, soups Tumeric-Salads, butter, eggs, fish, rice, vegetables (saffron-like flavor) Vanilla Extract-Baked goods, candy Vinegar-Salads, vegetables, meat marinades Walnut Extract-baked goods, candy   2. Choose your Foods Wisely   The following is a list of foods to avoid which are high in sodium:  Meats-Avoid all smoked, canned, salt cured, dried and kosher meat and fish as well as Anchovies   [employer] meats:Bologna, Liverwurst, Pastrami Canned meat or fish  Marinated herring Caviar    Pepperoni Corned Beef   Pizza Dried chipped beef  Salami Frozen breaded fish or meat Salt pork Frankfurters or hot dogs  Sardines Gefilte fish   Sausage Ham (boiled ham, Proscuitto Smoked butt    spiced ham)   Spam      TV Dinners Vegetables Canned vegetables (Regular) Relish Canned mushrooms  Sauerkraut Olives    Tomato juice Pickles  Bakery and Dessert Products Canned puddings  Cream pies Cheesecake   Decorated cakes Cookies  Beverages/Juices Tomato juice, regular  Gatorade   V-8 vegetable juice, regular  Breads and Cereals Biscuit mixes   Salted potato chips, corn chips, pretzels Bread stuffing mixes  Salted crackers and rolls Pancake and waffle mixes Self-rising flour  Seasonings Accent    Meat sauces Barbecue sauce  Meat tenderizer Catsup    Monosodium glutamate (MSG) Celery salt   Onion salt Chili sauce   Prepared mustard Garlic salt   Salt, seasoned  salt, sea salt Gravy mixes   Soy  sauce Horseradish   Steak sauce Ketchup   Tartar sauce Lite salt    Teriyaki sauce Marinade mixes   Worcestershire sauce  Others Baking powder   Cocoa and cocoa mixes Baking soda   Commercial casserole mixes Candy-caramels, chocolate  Dehydrated soups    Bars, fudge,nougats  Instant rice and pasta mixes Canned broth or soup  Maraschino cherries Cheese, aged and processed cheese and cheese spreads  Learning Assessment Quiz  Indicated T (for True) or F (for False) for each of the following statements:  _____ Fresh fruits and vegetables and unprocessed grains are generally low in sodium _____ Water may contain a considerable amount of sodium, depending on the source _____ You can always tell if a food is high in sodium by tasting it _____ Certain laxatives my be high in sodium and should be avoided unless prescribed   by a physician or pharmacist _____ Salt substitutes may be used freely by anyone on a sodium restricted diet _____ Sodium is present in table salt, food additives and as a natural component of   most foods _____ Table salt is approximately 90% sodium _____ Limiting sodium intake may help prevent excess fluid accumulation in the body _____ On a sodium-restricted diet, seasonings such as bouillon soy sauce, and    cooking wine should be used in place of table salt _____ On an ingredient list, a product which lists monosodium glutamate as the first   ingredient is an appropriate food to include on a low sodium diet  Circle the best answer(s) to the following statements (Hint: there may be more than one correct answer)  11. On a low-sodium diet, some acceptable snack items are:    A. Olives  F. Bean dip   K. Grapefruit juice    B. Salted Pretzels G. Commercial Popcorn   L. Canned peaches    C. Carrot Sticks  H. Bouillon   M. Unsalted nuts   D. [place] fries  I. Peanut butter crackers N. Salami   E. Sweet pickles J. Tomato Juice   O. Pizza  12.  Seasonings that may be  used freely on a reduced - sodium diet include   A. Lemon wedges F.Monosodium glutamate K. Celery seed    B.Soysauce   G. Pepper   L. Mustard powder   C. Sea salt  H. Cooking wine  M. Onion flakes   D. Vinegar  E. Prepared horseradish N. Salsa   E. Sage   J. Worcestershire sauce  O. Chutney

## 2023-02-14 DIAGNOSIS — G4733 Obstructive sleep apnea (adult) (pediatric): Secondary | ICD-10-CM | POA: Diagnosis not present

## 2023-03-04 ENCOUNTER — Ambulatory Visit: Payer: Self-pay

## 2023-03-04 DIAGNOSIS — Z0000 Encounter for general adult medical examination without abnormal findings: Secondary | ICD-10-CM

## 2023-03-04 LAB — POCT URINALYSIS DIPSTICK
Bilirubin, UA: NEGATIVE
Blood, UA: NEGATIVE
Glucose, UA: NEGATIVE
Ketones, UA: NEGATIVE
Leukocytes, UA: NEGATIVE
Nitrite, UA: NEGATIVE
Protein, UA: NEGATIVE
Spec Grav, UA: 1.02 (ref 1.010–1.025)
Urobilinogen, UA: 0.2 E.U./dL
pH, UA: 6 (ref 5.0–8.0)

## 2023-03-04 NOTE — Progress Notes (Signed)
Pt completed labs for physical. /CL,RMA 

## 2023-03-05 LAB — CMP12+LP+TP+TSH+6AC+PSA+CBC…
ALT: 73 IU/L — ABNORMAL HIGH (ref 0–44)
AST: 38 IU/L (ref 0–40)
Albumin/Globulin Ratio: 2.1 (ref 1.2–2.2)
Albumin: 4.6 g/dL (ref 3.8–4.9)
Alkaline Phosphatase: 110 IU/L (ref 44–121)
BUN/Creatinine Ratio: 24 — ABNORMAL HIGH (ref 9–20)
BUN: 22 mg/dL (ref 6–24)
Basophils Absolute: 0.1 10*3/uL (ref 0.0–0.2)
Basos: 1 %
Bilirubin Total: 0.5 mg/dL (ref 0.0–1.2)
Calcium: 9.3 mg/dL (ref 8.7–10.2)
Chloride: 105 mmol/L (ref 96–106)
Chol/HDL Ratio: 3.8 ratio (ref 0.0–5.0)
Cholesterol, Total: 188 mg/dL (ref 100–199)
Creatinine, Ser: 0.9 mg/dL (ref 0.76–1.27)
EOS (ABSOLUTE): 0.1 10*3/uL (ref 0.0–0.4)
Eos: 2 %
Estimated CHD Risk: 0.7 times avg. (ref 0.0–1.0)
Free Thyroxine Index: 2 (ref 1.2–4.9)
GGT: 98 IU/L — ABNORMAL HIGH (ref 0–65)
Globulin, Total: 2.2 g/dL (ref 1.5–4.5)
Glucose: 87 mg/dL (ref 70–99)
HDL: 49 mg/dL (ref >39)
Hematocrit: 52 % — ABNORMAL HIGH (ref 37.5–51.0)
Hemoglobin: 17.3 g/dL (ref 13.0–17.7)
Immature Grans (Abs): 0 10*3/uL (ref 0.0–0.1)
Immature Granulocytes: 0 %
Iron: 156 ug/dL (ref 38–169)
LDH: 197 IU/L (ref 121–224)
LDL Chol Calc (NIH): 113 mg/dL — ABNORMAL HIGH (ref 0–99)
Lymphocytes Absolute: 2.1 10*3/uL (ref 0.7–3.1)
Lymphs: 39 %
MCH: 26.8 pg (ref 26.6–33.0)
MCHC: 33.3 g/dL (ref 31.5–35.7)
MCV: 81 fL (ref 79–97)
Monocytes Absolute: 0.5 10*3/uL (ref 0.1–0.9)
Monocytes: 10 %
Neutrophils Absolute: 2.6 10*3/uL (ref 1.4–7.0)
Neutrophils: 48 %
Phosphorus: 4.3 mg/dL — ABNORMAL HIGH (ref 2.8–4.1)
Platelets: 196 10*3/uL (ref 150–450)
Potassium: 4.5 mmol/L (ref 3.5–5.2)
Prostate Specific Ag, Serum: 0.8 ng/mL (ref 0.0–4.0)
RBC: 6.45 x10E6/uL — ABNORMAL HIGH (ref 4.14–5.80)
RDW: 13.4 % (ref 11.6–15.4)
Sodium: 141 mmol/L (ref 134–144)
T3 Uptake Ratio: 30 % (ref 24–39)
T4, Total: 6.8 ug/dL (ref 4.5–12.0)
TSH: 3.55 u[IU]/mL (ref 0.450–4.500)
Total Protein: 6.8 g/dL (ref 6.0–8.5)
Triglycerides: 147 mg/dL (ref 0–149)
Uric Acid: 6.1 mg/dL (ref 3.8–8.4)
VLDL Cholesterol Cal: 26 mg/dL (ref 5–40)
WBC: 5.4 10*3/uL (ref 3.4–10.8)
eGFR: 98 mL/min/{1.73_m2} (ref >59)

## 2023-03-07 ENCOUNTER — Encounter: Payer: Self-pay | Admitting: Physician Assistant

## 2023-03-07 ENCOUNTER — Ambulatory Visit: Payer: Self-pay | Admitting: Physician Assistant

## 2023-03-07 VITALS — BP 143/76 | HR 65 | Temp 97.7°F | Resp 12 | Ht 73.0 in | Wt 310.0 lb

## 2023-03-07 DIAGNOSIS — D171 Benign lipomatous neoplasm of skin and subcutaneous tissue of trunk: Secondary | ICD-10-CM | POA: Insufficient documentation

## 2023-03-07 DIAGNOSIS — Z23 Encounter for immunization: Secondary | ICD-10-CM

## 2023-03-07 DIAGNOSIS — Z0000 Encounter for general adult medical examination without abnormal findings: Secondary | ICD-10-CM

## 2023-03-07 NOTE — Progress Notes (Signed)
[facility]  ____________________________________________   None    (approximate)  I have reviewed the triage vital signs and the nursing notes.   HISTORY  Chief Complaint No chief complaint on file.   HPI Michael Fuller is a 62 y.o. male patient presents for annual physical exam.  Patient voiced no concerns or complaints.         Past Medical History:  Diagnosis Date   Anxiety disorder    Complication of anesthesia    Nausae   COVID-19 virus infection 11/08/2020   Cough and congestion for several weeks with 4 to 5 days worsening.   DJD (degenerative joint disease), lumbosacral    Hyperlipidemia due to dietary fat intake    Nephrolithiasis    Osteoarthritis involving multiple joints on both sides of body    Persistent atrial fibrillation (HCC) 10/2020   Initial diagnosis with the setting of COVID-19 infection   RLS (restless legs syndrome)     Patient Active Problem List   Diagnosis Date Noted   Lipoma of torso    Abnormal nuclear stress test 01/18/2021   Persistent atrial fibrillation (HCC)-new onset 12/27/2020   Essential hypertension 12/27/2020   Hyperlipidemia due to dietary fat intake 12/27/2020   Loud snoring 12/27/2020   Morbid obesity 12/27/2020   DOE (dyspnea on exertion) 12/27/2020   Colon polyp, hyperplastic 07/04/2015   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   Rotator cuff syndrome 12/22/2014    Past Surgical History:  Procedure Laterality Date   BACK SURGERY  1996   Lumbosacral spine--two surgeries   CARDIOVERSION N/A 01/12/2021   Procedure: SUCCESSFUL CARDIOVERSION;  Surgeon: Lars Masson, MD;  Location: [facility] ENDOSCOPY;  Service: Cardiovascular;  Laterality: N/A;   CARPAL TUNNEL RELEASE Bilateral    LEFT HEART CATH AND CORONARY ANGIOGRAPHY N/A 02/10/2021   Procedure: LEFT HEART CATH AND CORONARY ANGIOGRAPHY;  Surgeon: Marykay Lex, MD;  Location: [facility] INVASIVE CV LAB;  Service:  Cardiovascular; Ostial proximal RCA 30%.  Mid to distal LAD 30% distal LAD tapers to small caliber vessel but does not reach the apex.  Severely elevated LVEDP of 30 mmHg. ==> false-positive stress test, but diastolic heart failure.Marland Kitchen   MASS EXCISION Right 04/26/2021   Procedure: MINOR EXCISION OF SKIN NEOPLASM; BUTTOCK;  Surgeon: Franky Macho, MD;  Location: [facility] ORS;  Service: General;  Laterality: Right;   NM MYOVIEW LTD  01/11/2021    INTERMEDIATE RISK study consistent with ischemia.  Medium size mild severity reversible perfusion defect in the basal inferoseptal, mid inferoseptal and apical inferior location.  Unable to calculate EF and wall motion due to A. fib. --FALSE POSITIVE   SHOULDER SURGERY Bilateral    TRANSTHORACIC ECHOCARDIOGRAM  11/25/2020   (Post COVID & New Afib): EF 65 to 70%.  Hyperdynamic LV.  Normal diastolic pressures.  Moderate LA dilation.  Otherwise normal study.  Valves, normal RV/RA pressure.    Prior to Admission medications   Medication Sig Start Date End Date Taking? Authorizing Provider  apixaban (ELIQUIS) 5 MG TABS tablet Take 1 tablet (5 mg total) by mouth 2 (two) times daily. 01/28/23   Dyann Kief, PA-C  Carboxymethylcellul-Glycerin (CLEAR EYES FOR DRY EYES) 1-0.25 % SOLN Place 1 drop into both eyes daily as needed (Dry eyes).    [provider]  diltiazem (CARDIZEM CD) 180 MG 24 hr capsule Take 180 mg by mouth daily. 12/27/22   [provider]  pramipexole (MIRAPEX) 1 MG tablet Take 1 mg by  mouth daily.    [provider]  rosuvastatin (CRESTOR) 20 MG tablet Take 20 mg by mouth at bedtime. 11/24/22   [provider]  venlafaxine (EFFEXOR) 75 MG tablet Take 75 mg by mouth 2 (two) times daily. 12/23/20   [provider]    Allergies Patient has no known allergies.  Family History  Problem Relation Age of Onset   Hypothyroidism Mother    Hypertension Father    Healthy Sister    Healthy Brother    Hypertension  Maternal Grandmother    Diabetes Mellitus II Paternal Grandfather     Social History Social History   Tobacco Use   Smoking status: Never   Smokeless tobacco: Never  Substance Use Topics   Alcohol use: Yes    Alcohol/week: 4.0 standard drinks of alcohol    Types: 4 Cans of beer per week   Drug use: Never    Review of Systems Constitutional: No fever/chills Eyes: No visual changes. ENT: No sore throat. Cardiovascular: Denies chest pain. Respiratory: Denies shortness of breath. Gastrointestinal: No abdominal pain.  No nausea, no vomiting.  No diarrhea.  No constipation. Genitourinary: Negative for dysuria. Musculoskeletal: Negative for back pain. Skin: Negative for rash. Neurological: Negative for headaches, focal weakness or numbness. Psychiatric: Anxiety Endocrine: Hyperlipidemia  ____________________________________________   PHYSICAL EXAM:  VITAL SIGNS: BP 143/76  BP Location Left Arm  Patient Position Sitting  Cuff Size Large  Pulse 65  Resp 12  Temp 97.7 F (36.5 C)  Temp src Temporal  SpO2 95 %  Weight 310 lb (140.6 kg)  Height 6\' 1"  (1.854 m)   BMI 40.90 kg/m2  BSA 2.69 m2   Constitutional: Alert and oriented. Well appearing and in no acute distress. Eyes: Conjunctivae are normal. PERRL. EOMI. Head: Atraumatic. Nose: No congestion/rhinnorhea. Mouth/Throat: Mucous membranes are moist.  Oropharynx non-erythematous. Neck: No stridor.  No cervical spine tenderness to palpation. Hematological/Lymphatic/Immunilogical: No cervical lymphadenopathy. Cardiovascular: Normal rate, regular rhythm. Grossly normal heart sounds.  Good peripheral circulation. Respiratory: Normal respiratory effort.  No retractions. Lungs CTAB. Gastrointestinal: Soft and nontender.  Distention secondary to body habitus. No abdominal bruits. No CVA tenderness. Genitourinary: Deferred Musculoskeletal: No lower extremity tenderness nor edema.  No joint effusions. Neurologic:  Normal  speech and language. No gross focal neurologic deficits are appreciated. No gait instability. Skin:  Skin is warm, dry and intact. No rash noted. Psychiatric: Mood and affect are normal. Speech and behavior are normal.  ____________________________________________   LABS      Component Ref Range & Units 3 d ago 13 yr ago  Color, UA yellow   Clarity, UA clear   Glucose, UA Negative Negative   Bilirubin, UA neg   Ketones, UA neg   Spec Grav, UA 1.010 - 1.025 1.020   Blood, UA neg   pH, UA 5.0 - 8.0 6.0   Protein, UA Negative Negative   Urobilinogen, UA 0.2 or 1.0 E.U./dL 0.2 0.2 R  Nitrite, UA neg   Leukocytes, UA Negative Negative NEGATIVE R  Appearance  CLOUDY Abnormal  R  Odor    Resulting Agency  [facility]                             Component Ref Range & Units 3 d ago (03/04/23) 1 yr ago (07/27/21) 2 yr ago (02/06/21) 2 yr ago (02/06/21) 2 yr ago (01/09/21) 2 yr ago (01/09/21) 2 yr ago (11/08/20) 2 yr  ago (11/08/20)  Glucose 70 - 99 mg/dL 87  161 High  R   096 High  R 154 High  CM   Uric Acid 3.8 - 8.4 mg/dL 6.1 6.7 CM        Comment:            Therapeutic target for gout patients: <6.0  BUN 6 - 24 mg/dL 22  11   10 20  R   Creatinine, Ser 0.76 - 1.27 mg/dL 0.45  4.09   8.11 CM 9.14 R   eGFR >59 mL/min/1.73 98  86       BUN/Creatinine Ratio 9 - 20 24 High   11   9    Sodium 134 - 144 mmol/L 141  143   141 139 R   Potassium 3.5 - 5.2 mmol/L 4.5  4.7   4.4 3.3 Low  R   Chloride 96 - 106 mmol/L 105  105   104 104 R   Calcium 8.7 - 10.2 mg/dL 9.3  9.4   9.1 8.9 R   Phosphorus 2.8 - 4.1 mg/dL 4.3 High          Total Protein 6.0 - 8.5 g/dL 6.8         Albumin 3.8 - 4.9 g/dL 4.6         Globulin, Total 1.5 - 4.5 g/dL 2.2         Albumin/Globulin Ratio 1.2 - 2.2 2.1         Bilirubin Total 0.0 - 1.2 mg/dL 0.5         Alkaline Phosphatase 44 - 121 IU/L 110         LDH 121 - 224 IU/L 197         AST 0 - 40 IU/L 38         ALT 0 - 44  IU/L 73 High          GGT 0 - 65 IU/L 98 High          Iron 38 - 169 ug/dL 782         Cholesterol, Total 100 - 199 mg/dL 956         Triglycerides 0 - 149 mg/dL 213         HDL >08 mg/dL 49         VLDL Cholesterol Cal 5 - 40 mg/dL 26         LDL Chol Calc (NIH) 0 - 99 mg/dL 657 High          Chol/HDL Ratio 0.0 - 5.0 ratio 3.8         Comment:                                   T. Chol/HDL Ratio                                             Men  Women                               1/2 Avg.Risk  3.4    3.3  Avg.Risk  5.0    4.4                                2X Avg.Risk  9.6    7.1                                3X Avg.Risk 23.4   11.0  Estimated CHD Risk 0.0 - 1.0 times avg. 0.7         Comment: The CHD Risk is based on the T. Chol/HDL ratio. Other factors affect CHD Risk such as hypertension, smoking, diabetes, severe obesity, and family history of premature CHD.  TSH 0.450 - 4.500 uIU/mL 3.550         T4, Total 4.5 - 12.0 ug/dL 6.8         T3 Uptake Ratio 24 - 39 % 30         Free Thyroxine Index 1.2 - 4.9 2.0         Prostate Specific Ag, Serum 0.0 - 4.0 ng/mL 0.8         Comment: Roche ECLIA methodology. According to the American Urological Association, Serum PSA should decrease and remain at undetectable levels after radical prostatectomy. The AUA defines biochemical recurrence as an initial PSA value 0.2 ng/mL or greater followed by a subsequent confirmatory PSA value 0.2 ng/mL or greater. Values obtained with different assay methods or kits cannot be used interchangeably. Results cannot be interpreted as absolute evidence of the presence or absence of malignant disease.  WBC 3.4 - 10.8 x10E3/uL 5.4   8.6 7.5   5.4 R  RBC 4.14 - 5.80 x10E6/uL 6.45 High    5.04 4.93   5.14 R  Hemoglobin 13.0 - 17.7 g/dL 16.1   09.6 04.5   40.9 R  Hematocrit 37.5 - 51.0 % 52.0 High    44.4 44.0   44.8 R  MCV 79 - 97 fL 81   88 89   87.2 R   MCH 26.6 - 33.0 pg 26.8   29.8 30.4   29.8 R  MCHC 31.5 - 35.7 g/dL 81.1   91.4 78.2   95.6 R  RDW 11.6 - 15.4 % 13.4   13.4 14.2   12.3 R  Platelets 150 - 450 x10E3/uL 196   181 205   174 R  Neutrophils Not Estab. % 48         Lymphs Not Estab. % 39         Monocytes Not Estab. % 10         Eos Not Estab. % 2         Basos Not Estab. % 1         Neutrophils Absolute 1.4 - 7.0 x10E3/uL 2.6         Lymphocytes Absolute 0.7 - 3.1 x10E3/uL 2.1         Monocytes Absolute 0.1 - 0.9 x10E3/uL 0.5         EOS (ABSOLUTE) 0.0 - 0.4 x10E3/uL 0.1         Basophils Absolute 0.0 - 0.2 x10E3/uL 0.1         Immature Granulocytes Not Estab. % 0         Immature Grans (Abs)              ____________________________________________  EKG  Sinus rhythm at  66 bpm ____________________________________________  ____________________________________________     ____________________________________________   INITIAL IMPRESSION / ASSESSMENT AND PLAN   As part of my medical decision making, I reviewed the following data within the electronic MEDICAL RECORD NUMBER       No acute findings on physical exam, EKG, and labs.  Patient advised to follow-up with family doctor as needed.     ____________________________________________   FINAL CLINICAL IMPRESSION     ED Discharge Orders     None        Note:  This document was prepared using Dragon voice recognition software and may include unintentional dictation errors.

## 2023-03-17 DIAGNOSIS — G4733 Obstructive sleep apnea (adult) (pediatric): Secondary | ICD-10-CM | POA: Diagnosis not present

## 2023-04-16 DIAGNOSIS — G4733 Obstructive sleep apnea (adult) (pediatric): Secondary | ICD-10-CM | POA: Diagnosis not present

## 2023-05-17 DIAGNOSIS — G4733 Obstructive sleep apnea (adult) (pediatric): Secondary | ICD-10-CM | POA: Diagnosis not present

## 2023-05-21 DIAGNOSIS — M170 Bilateral primary osteoarthritis of knee: Secondary | ICD-10-CM | POA: Insufficient documentation

## 2023-05-28 DIAGNOSIS — M170 Bilateral primary osteoarthritis of knee: Secondary | ICD-10-CM | POA: Diagnosis not present

## 2023-06-04 DIAGNOSIS — M170 Bilateral primary osteoarthritis of knee: Secondary | ICD-10-CM | POA: Diagnosis not present

## 2023-06-16 DIAGNOSIS — G4733 Obstructive sleep apnea (adult) (pediatric): Secondary | ICD-10-CM | POA: Diagnosis not present

## 2023-07-17 DIAGNOSIS — G4733 Obstructive sleep apnea (adult) (pediatric): Secondary | ICD-10-CM | POA: Diagnosis not present

## 2023-08-06 ENCOUNTER — Encounter: Payer: Self-pay | Admitting: Internal Medicine

## 2023-08-06 ENCOUNTER — Ambulatory Visit: Payer: 59 | Admitting: Internal Medicine

## 2023-08-06 ENCOUNTER — Ambulatory Visit: Payer: 59 | Attending: Internal Medicine | Admitting: Internal Medicine

## 2023-08-06 NOTE — Progress Notes (Signed)
Erroneous encounter - please disregard.

## 2023-08-07 ENCOUNTER — Encounter: Payer: Self-pay | Admitting: Internal Medicine

## 2023-08-17 DIAGNOSIS — G4733 Obstructive sleep apnea (adult) (pediatric): Secondary | ICD-10-CM | POA: Diagnosis not present

## 2023-09-16 DIAGNOSIS — G4733 Obstructive sleep apnea (adult) (pediatric): Secondary | ICD-10-CM | POA: Diagnosis not present

## 2023-10-17 DIAGNOSIS — G4733 Obstructive sleep apnea (adult) (pediatric): Secondary | ICD-10-CM | POA: Diagnosis not present

## 2023-11-16 DIAGNOSIS — G4733 Obstructive sleep apnea (adult) (pediatric): Secondary | ICD-10-CM | POA: Diagnosis not present

## 2023-12-17 DIAGNOSIS — G4733 Obstructive sleep apnea (adult) (pediatric): Secondary | ICD-10-CM | POA: Diagnosis not present

## 2023-12-30 DIAGNOSIS — M170 Bilateral primary osteoarthritis of knee: Secondary | ICD-10-CM | POA: Diagnosis not present

## 2023-12-30 DIAGNOSIS — M545 Low back pain, unspecified: Secondary | ICD-10-CM | POA: Diagnosis not present

## 2024-04-01 DIAGNOSIS — M170 Bilateral primary osteoarthritis of knee: Secondary | ICD-10-CM | POA: Diagnosis not present

## 2024-04-02 ENCOUNTER — Telehealth: Payer: Self-pay | Admitting: *Deleted

## 2024-04-02 DIAGNOSIS — I4819 Other persistent atrial fibrillation: Secondary | ICD-10-CM

## 2024-04-02 NOTE — Telephone Encounter (Signed)
   Pre-operative Risk Assessment    Patient Name: Michael Fuller  DOB: 29-Dec-1960 MRN: [id number]   Date of last office visit: 01/28/23 Theotis Flake, PAC Date of next office visit: NONE   Request for Surgical Clearance    Procedure:  RIGHT TOTAL KNEE ARTHROPLASTY  Date of Surgery:  Clearance 07/13/24                                Surgeon:  DR. Liliane Rei Surgeon's Group or Practice Name:  [facility] Phone number:  [phone] KERRI MAZE Fax number:  [phone]   Type of Clearance Requested:   - Medical  - Pharmacy:  Hold Apixaban  (Eliquis )     Type of Anesthesia:  CHOICE   Additional requests/questions:    [name]   04/02/2024, 4:31 PM

## 2024-04-06 NOTE — Addendum Note (Signed)
 Addended by: Casy Tavano K on: 04/06/2024 07:30 AM   Modules accepted: Orders

## 2024-04-06 NOTE — Telephone Encounter (Signed)
   Name: Michael Fuller  DOB: 11/01/1961  MRN: [id number]  Primary Cardiologist: Randene Bustard, MD  Chart reviewed as part of pre-operative protocol coverage. Because of Michael Fuller's past medical history and time since last visit, he will require a follow-up in-office visit in order to better assess preoperative cardiovascular risk.  Pre-op covering staff: - Please schedule appointment and call patient to inform them. If patient already had an upcoming appointment within acceptable timeframe, please add "pre-op clearance" to the appointment notes so provider is aware. - Please contact requesting surgeon's office via preferred method (i.e, phone, fax) to inform them of need for appointment prior to surgery.  This message will also be routed to pharmacy pool for input on holding Eliquis  as requested below so that this information is available to the clearing provider at time of patient's appointment.   Updated CBC and BMET will need to be completed prior to pharmacy recommendations on Eliquis  hold.  Lab orders have been placed.  Patient is to have labs drawn well in advance to office visit.  Ava Boatman, NP  04/06/2024, 8:45 AM

## 2024-04-06 NOTE — Telephone Encounter (Signed)
 Will clarify with preop APP if the pt is going to need an appt in office or tele as well.

## 2024-04-06 NOTE — Telephone Encounter (Signed)
 Left message for the pt to call back. Pt will need in office for preop clearance. Pt also needs labs prior to in office appt per preop APP Palmer Bobo, NP. Pt needs BMET and CBC.

## 2024-04-07 NOTE — Telephone Encounter (Signed)
 OV preop clearance appt has now been scheduled, med rec and consent done.

## 2024-04-09 DIAGNOSIS — M170 Bilateral primary osteoarthritis of knee: Secondary | ICD-10-CM | POA: Diagnosis not present

## 2024-04-15 DIAGNOSIS — M47816 Spondylosis without myelopathy or radiculopathy, lumbar region: Secondary | ICD-10-CM | POA: Diagnosis not present

## 2024-04-15 DIAGNOSIS — M5459 Other low back pain: Secondary | ICD-10-CM | POA: Diagnosis not present

## 2024-04-15 DIAGNOSIS — G8929 Other chronic pain: Secondary | ICD-10-CM | POA: Diagnosis not present

## 2024-04-16 ENCOUNTER — Ambulatory Visit: Payer: Self-pay

## 2024-04-16 DIAGNOSIS — Z0000 Encounter for general adult medical examination without abnormal findings: Secondary | ICD-10-CM

## 2024-04-16 LAB — POCT URINALYSIS DIPSTICK
Bilirubin, UA: NEGATIVE
Blood, UA: NEGATIVE
Glucose, UA: NEGATIVE
Ketones, UA: NEGATIVE
Leukocytes, UA: NEGATIVE
Nitrite, UA: NEGATIVE
Protein, UA: NEGATIVE
Spec Grav, UA: 1.02 (ref 1.010–1.025)
Urobilinogen, UA: 0.2 U/dL
pH, UA: 6 (ref 5.0–8.0)

## 2024-04-17 LAB — CMP12+LP+TP+TSH+6AC+PSA+CBC…
ALT: 56 IU/L — ABNORMAL HIGH (ref 0–44)
AST: 30 IU/L (ref 0–40)
Albumin: 4.9 g/dL (ref 3.8–4.9)
Alkaline Phosphatase: 93 IU/L (ref 44–121)
BUN/Creatinine Ratio: 13 (ref 10–24)
BUN: 14 mg/dL (ref 8–27)
Basophils Absolute: 0.1 10*3/uL (ref 0.0–0.2)
Basos: 1 %
Bilirubin Total: 0.4 mg/dL (ref 0.0–1.2)
Calcium: 9.6 mg/dL (ref 8.6–10.2)
Chloride: 104 mmol/L (ref 96–106)
Chol/HDL Ratio: 2.6 ratio (ref 0.0–5.0)
Cholesterol, Total: 160 mg/dL (ref 100–199)
Creatinine, Ser: 1.08 mg/dL (ref 0.76–1.27)
EOS (ABSOLUTE): 0.1 10*3/uL (ref 0.0–0.4)
Eos: 1 %
Estimated CHD Risk: <0.5 times avg. (ref 0.0–1.0)
Free Thyroxine Index: 2.2 (ref 1.2–4.9)
GGT: 98 IU/L — ABNORMAL HIGH (ref 0–65)
Globulin, Total: 2.3 g/dL (ref 1.5–4.5)
Glucose: 99 mg/dL (ref 70–99)
HDL: 61 mg/dL (ref >39)
Hematocrit: 44.9 % (ref 37.5–51.0)
Hemoglobin: 14.6 g/dL (ref 13.0–17.7)
Immature Grans (Abs): 0 10*3/uL (ref 0.0–0.1)
Immature Granulocytes: 1 %
Iron: 88 ug/dL (ref 38–169)
LDH: 192 IU/L (ref 121–224)
LDL Chol Calc (NIH): 83 mg/dL (ref 0–99)
Lymphocytes Absolute: 3 10*3/uL (ref 0.7–3.1)
Lymphs: 35 %
MCH: 29 pg (ref 26.6–33.0)
MCHC: 32.5 g/dL (ref 31.5–35.7)
MCV: 89 fL (ref 79–97)
Monocytes Absolute: 0.6 10*3/uL (ref 0.1–0.9)
Monocytes: 8 %
Neutrophils Absolute: 4.6 10*3/uL (ref 1.4–7.0)
Neutrophils: 54 %
Phosphorus: 3.4 mg/dL (ref 2.8–4.1)
Platelets: 201 10*3/uL (ref 150–450)
Potassium: 4.4 mmol/L (ref 3.5–5.2)
Prostate Specific Ag, Serum: 0.6 ng/mL (ref 0.0–4.0)
RBC: 5.03 x10E6/uL (ref 4.14–5.80)
RDW: 13.3 % (ref 11.6–15.4)
Sodium: 141 mmol/L (ref 134–144)
T3 Uptake Ratio: 30 % (ref 24–39)
T4, Total: 7.3 ug/dL (ref 4.5–12.0)
TSH: 1.36 u[IU]/mL (ref 0.450–4.500)
Total Protein: 7.2 g/dL (ref 6.0–8.5)
Triglycerides: 83 mg/dL (ref 0–149)
Uric Acid: 9.4 mg/dL — ABNORMAL HIGH (ref 3.8–8.4)
VLDL Cholesterol Cal: 16 mg/dL (ref 5–40)
WBC: 8.5 10*3/uL (ref 3.4–10.8)
eGFR: 79 mL/min/{1.73_m2} (ref >59)

## 2024-04-21 ENCOUNTER — Telehealth: Payer: Self-pay

## 2024-04-21 NOTE — Telephone Encounter (Signed)
 Patient with diagnosis of afib on Eliquis  for anticoagulation.    Procedure: Right total knee arthroplasty  Date of procedure: 07/27/24   CHA2DS2-VASc Score = 3   This indicates a 3.2% annual risk of stroke. The patient's score is based upon: CHF History: 1 HTN History: 1 Diabetes History: 0 Stroke History: 0 Vascular Disease History: 1 Age Score: 0 Gender Score: 0    CrCl 107 mL/min Platelet count 201  Patient has not  had an Afib/aflutter ablation within the last 3 months or DCCV within the last 30 days   Per office protocol, patient can hold Eliquis  for 3 days prior to procedure.     **This guidance is not considered finalized until pre-operative APP has relayed final recommendations.**

## 2024-04-21 NOTE — Telephone Encounter (Signed)
   Pre-operative Risk Assessment    Patient Name: Michael Fuller  DOB: 25-Jul-1961 MRN: [id number]   Date of last office visit: 01/28/23 Date of next office visit: 06/30/24   Request for Surgical Clearance    Procedure:  Right total knee arthroplasty    Date of Surgery:  Clearance 07/27/24                                 Surgeon:  Dr. Liliane Rei Surgeon's Group or Practice Name:  [facility]  Phone number:  [phone] Fax number:  [phone]   Type of Clearance Requested:   - Medical  - Pharmacy:  Hold Apixaban  (Eliquis ) Not indicated    Type of Anesthesia:  Choice    Additional requests/questions:    Mansfield Seip   04/21/2024, 1:26 PM

## 2024-04-21 NOTE — Telephone Encounter (Signed)
   Name: Michael Fuller  DOB: 05/15/1961  MRN: [id number]  Primary Cardiologist: Randene Bustard, MD  Chart reviewed as part of pre-operative protocol coverage. The patient has an upcoming visit scheduled with Lovette Rud, PA on 06/30/24 at which time clearance can be addressed in case there are any issues that would impact surgical recommendations.  Right total knee arthroplasty is not scheduled until 07/27/24 as below. I added preop FYI to appointment note so that provider is aware to address at time of outpatient visit.  Per office protocol the cardiology provider should forward their finalized clearance decision and recommendations regarding antiplatelet therapy to the requesting party below.    This message will also be routed to pharmacy pool for input on holding Eliquis  as requested below so that this information is available to the clearing provider at time of patient's appointment.   I will route this message as FYI to requesting party and remove this message from the preop box as separate preop APP input not needed at this time.   Please call with any questions.  Ivyanna Sibert D Salar Molden, NP  04/21/2024, 4:37 PM

## 2024-04-21 NOTE — Telephone Encounter (Signed)
 Pt has in office appt with Lovette Rud, [facility] 06/30/24

## 2024-04-22 ENCOUNTER — Ambulatory Visit: Payer: Self-pay | Admitting: Physician Assistant

## 2024-04-22 ENCOUNTER — Encounter: Payer: Self-pay | Admitting: Physician Assistant

## 2024-04-22 VITALS — BP 152/85 | HR 71 | Temp 97.6°F | Resp 16 | Ht 74.0 in | Wt 306.0 lb

## 2024-04-22 DIAGNOSIS — Z0000 Encounter for general adult medical examination without abnormal findings: Secondary | ICD-10-CM

## 2024-04-22 NOTE — Progress Notes (Signed)
 [facility]   ____________________________________________   None    (approximate)  I have reviewed the triage vital signs and the nursing notes.   HISTORY  Chief Complaint Annual Exam    HPI Michael Fuller is a 63 y.o. male patient presents for annual physical exam.  Patient states chronic back pain for new onset radiculopathy to the right lower extremity.  Patient is being followed with orthopedics and has MRI consult pending.  Patient also states increased right knee pain and is scheduled for right knee replacement in September 2025.         Past Medical History:  Diagnosis Date   Anxiety disorder    Complication of anesthesia    Nausae   COVID-19 virus infection 11/08/2020   Cough and congestion for several weeks with 4 to 5 days worsening.   DJD (degenerative joint disease), lumbosacral    Hyperlipidemia due to dietary fat intake    Nephrolithiasis    Osteoarthritis involving multiple joints on both sides of body    Persistent atrial fibrillation (HCC) 10/2020   Initial diagnosis with the setting of COVID-19 infection   RLS (restless legs syndrome)     Patient Active Problem List   Diagnosis Date Noted   Low back pain 12/30/2023   ERRONEOUS ENCOUNTER--DISREGARD 08/06/2023   Osteoarthritis of both knees 05/21/2023   Lipoma of skin and subcutaneous tissue of trunk 03/07/2023   Pain in joint of left knee 01/07/2023   Pain in joint of right knee 01/07/2023   Lipoma of torso    Abnormal nuclear stress test 01/18/2021   Persistent atrial fibrillation (HCC)-new onset 12/27/2020   Essential hypertension 12/27/2020   Hyperlipidemia due to dietary fat intake 12/27/2020   Loud snoring 12/27/2020   Morbid obesity (HCC) 12/27/2020   DOE (dyspnea on exertion) 12/27/2020   Colon polyp, hyperplastic 07/04/2015   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   Rotator cuff syndrome 12/22/2014   Arthritis of left acromioclavicular  joint 12/22/2014    Past Surgical History:  Procedure Laterality Date   BACK SURGERY  1996   Lumbosacral spine--two surgeries   CARDIOVERSION N/A 01/12/2021   Procedure: SUCCESSFUL CARDIOVERSION;  Surgeon: Liza Riggers, MD;  Location: [facility] ENDOSCOPY;  Service: Cardiovascular;  Laterality: N/A;   CARPAL TUNNEL RELEASE Bilateral    LEFT HEART CATH AND CORONARY ANGIOGRAPHY N/A 02/10/2021   Procedure: LEFT HEART CATH AND CORONARY ANGIOGRAPHY;  Surgeon: Arleen Lacer, MD;  Location: [facility] INVASIVE CV LAB;  Service: Cardiovascular; Ostial proximal RCA 30%.  Mid to distal LAD 30% distal LAD tapers to small caliber vessel but does not reach the apex.  Severely elevated LVEDP of 30 mmHg. ==> false-positive stress test, but diastolic heart failure.Aaron Aas   MASS EXCISION Right 04/26/2021   Procedure: MINOR EXCISION OF SKIN NEOPLASM; BUTTOCK;  Surgeon: Alanda Allegra, MD;  Location: [facility]S;  Service: General;  Laterality: Right;   NM MYOVIEW  LTD  01/11/2021    INTERMEDIATE RISK study consistent with ischemia.  Medium size mild severity reversible perfusion defect in the basal inferoseptal, mid inferoseptal and apical inferior location.  Unable to calculate EF and wall motion due to A. fib. --FALSE POSITIVE   SHOULDER SURGERY Bilateral    TRANSTHORACIC ECHOCARDIOGRAM  11/25/2020   (Post COVID & New Afib): EF 65 to 70%.  Hyperdynamic LV.  Normal diastolic pressures.  Moderate LA dilation.  Otherwise normal study.  Valves, normal RV/RA pressure.    Prior to Admission medications  Medication Sig Start Date End Date Taking? Authorizing Provider  apixaban  (ELIQUIS ) 5 MG TABS tablet Take 1 tablet (5 mg total) by mouth 2 (two) times daily. 01/28/23   Flo Hummingbird, PA-C  Carboxymethylcellul-Glycerin (CLEAR EYES FOR DRY EYES) 1-0.25 % SOLN Place 1 drop into both eyes daily as needed (Dry eyes).    [provider]  diltiazem (CARDIZEM CD) 180 MG 24 hr capsule Take 180 mg by mouth daily. 12/27/22   [provider]  pramipexole (MIRAPEX) 1 MG tablet Take 1 mg by mouth daily.    [provider]  rosuvastatin (CRESTOR) 20 MG tablet Take 20 mg by mouth at bedtime. 11/24/22   [provider]  venlafaxine (EFFEXOR) 75 MG tablet Take 75 mg by mouth 2 (two) times daily. 12/23/20   [provider]    Allergies Patient has no known allergies.  Family History  Problem Relation Age of Onset   Hypothyroidism Mother    Hypertension Father    Healthy Sister    Healthy Brother    Hypertension Maternal Grandmother    Diabetes Mellitus II Paternal Grandfather     Social History Social History   Tobacco Use   Smoking status: Never   Smokeless tobacco: Never  Substance Use Topics   Alcohol use: Yes    Alcohol/week: 4.0 standard drinks of alcohol    Types: 4 Cans of beer per week   Drug use: Never    Review of Systems Constitutional: No fever/chills Eyes: No visual changes. ENT: No sore throat. Cardiovascular: Denies chest pain. Respiratory: Denies shortness of breath. Gastrointestinal: No abdominal pain.  No nausea, no vomiting.  No diarrhea.  No constipation. Genitourinary: Negative for dysuria. Musculoskeletal: Negative for back pain. Skin: Negative for rash. Neurological: Negative for headaches, focal weakness or numbness. Psychiatric: Anxiety Endocrine: Hyperlipidemia and hypertension  ____________________________________________   PHYSICAL EXAM:  VITAL SIGNS: Constitutional: Alert and oriented. Well appearing and in no acute distress. Eyes: Conjunctivae are normal. PERRL. EOMI. Head: Atraumatic. Nose: No congestion/rhinnorhea. Mouth/Throat: Mucous membranes are moist.  Oropharynx non-erythematous. Neck: No stridor. No cervical spine tenderness to palpation. Hematological/Lymphatic/Immunilogical: No cervical lymphadenopathy. Cardiovascular: Normal rate, regular rhythm. Grossly normal heart sounds.  Good peripheral circulation. Respiratory:  Normal respiratory effort.  No retractions. Lungs CTAB. Gastrointestinal: Soft and nontender. No distention. No abdominal bruits. No CVA tenderness. Genitourinary: Deferred Musculoskeletal: No lower extremity tenderness nor edema.  No joint effusions. Neurologic:  Normal speech and language. No gross focal neurologic deficits are appreciated. No gait instability. Skin:  Skin is warm, dry and intact. No rash noted. Psychiatric: Mood and affect are normal. Speech and behavior are normal.  ____________________________________________   LABS        Component Ref Range & Units (hover) 6 d ago 1 yr ago 14 yr ago  Color, UA dark yellow yellow   Clarity, UA clear clear   Glucose, UA Negative Negative   Bilirubin, UA neg neg   Ketones, UA neg neg   Spec Grav, UA 1.020 1.020   Blood, UA neg neg   pH, UA 6.0 6.0   Protein, UA Negative Negative   Urobilinogen, UA 0.2 0.2 0.2 R  Nitrite, UA neg neg   Leukocytes, UA Negative Negative NEGATIVE R  Appearance   CLOUDY Abnormal  R  Odor                      Component Ref Range & Units (hover) 6 d ago (04/16/24) 1 yr  ago (03/04/23) 2 yr ago (07/27/21) 3 yr ago (02/06/21) 3 yr ago (02/06/21) 3 yr ago (01/09/21) 3 yr ago (01/09/21)  Glucose 99 87  115 High  R   112 High  R  Uric Acid 9.4 High  6.1 CM 6.7 CM      Comment:            Therapeutic target for gout patients: <6.0  BUN 14 22 R  11 R   10 R  Creatinine, Ser 1.08 0.90  1.02   1.10 CM  eGFR 79 98  86     BUN/Creatinine Ratio 13 24 High  R  11 R   9 R  Sodium 141 141  143   141  Potassium 4.4 4.5  4.7   4.4  Chloride 104 105  105   104  Calcium 9.6 9.3 R  9.4 R   9.1 R  Phosphorus 3.4 4.3 High        Total Protein 7.2 6.8       Albumin 4.9 4.6       Globulin, Total 2.3 2.2       Bilirubin Total 0.4 0.5       Alkaline Phosphatase 93 110       LDH 192 197       AST 30 38       ALT 56 High  73 High        GGT 98 High  98 High        Iron 88 156       Cholesterol, Total 160 188        Triglycerides 83 147       HDL 61 49       VLDL Cholesterol Cal 16 26       LDL Chol Calc (NIH) 83 113 High        Chol/HDL Ratio 2.6 3.8 CM       Comment:                                   T. Chol/HDL Ratio                                             Men  Women                               1/2 Avg.Risk  3.4    3.3                                   Avg.Risk  5.0    4.4                                2X Avg.Risk  9.6    7.1                                3X Avg.Risk 23.4   11.0  Estimated CHD Risk  < 0.5 0.7 CM       Comment: The CHD Risk is based on the T. Chol/HDL  ratio. Other factors affect CHD Risk such as hypertension, smoking, diabetes, severe obesity, and family history of premature CHD.  TSH 1.360 3.550       T4, Total 7.3 6.8       T3 Uptake Ratio 30 30       Free Thyroxine Index 2.2 2.0       Prostate Specific Ag, Serum 0.6 0.8 CM       Comment: Roche ECLIA methodology. According to the American Urological Association, Serum PSA should decrease and remain at undetectable levels after radical prostatectomy. The AUA defines biochemical recurrence as an initial PSA value 0.2 ng/mL or greater followed by a subsequent confirmatory PSA value 0.2 ng/mL or greater. Values obtained with different assay methods or kits cannot be used interchangeably. Results cannot be interpreted as absolute evidence of the presence or absence of malignant disease.  WBC 8.5 5.4   8.6 7.5   RBC 5.03 6.45 High    5.04 4.93   Hemoglobin 14.6 17.3   15.0 15.0   Hematocrit 44.9 52.0 High    44.4 44.0   MCV 89 81   88 89   MCH 29.0 26.8   29.8 30.4   MCHC 32.5 33.3   33.8 34.1   RDW 13.3 13.4   13.4 14.2   Platelets 201 196   181 205   Neutrophils 54 48       Lymphs 35 39       Monocytes 8 10       Eos 1 2       Basos 1 1       Neutrophils Absolute 4.6 2.6       Lymphocytes Absolute 3.0 2.1       Monocytes Absolute 0.6 0.5       EOS (ABSOLUTE) 0.1 0.1       Basophils Absolute 0.1 0.1        Immature Granulocytes 1 0       Immature Grans (Abs) 0.0 0.0                 ____________________________________________  EKG  Sinus rhythm at 67 bpm ____________________________________________    ____________________________________________   INITIAL IMPRESSION / ASSESSMENT AND PLAN / ED COURSE  As part of my medical decision making, I reviewed the following data within the electronic MEDICAL RECORD NUMBER       No acute findings on physical exam, EKG, or labs.     ____________________________________________   FINAL CLINICAL IMPRESSION     ED Discharge Orders     None        Note:  This document was prepared using Dragon voice recognition software and may include unintentional dictation errors.

## 2024-04-22 NOTE — Progress Notes (Signed)
 Pt presents today to complete physical, Pt didn't voice any concerns at this time Gretel Acre

## 2024-06-23 NOTE — Progress Notes (Signed)
 Cardiology Office Note   Date:  06/30/2024  ID:  Michael Fuller, DOB 1960/12/25, MRN [id number] PCP: Sheryle Carwin, MD  [facility] Providers Cardiologist:  Alm Clay, MD    History of Present Illness Michael Fuller is a 63 y.o. male with a past medical history of persistent atrial fibrillation (DCCV 12/2020 to normal sinus rhythm previously on Xarelto ), reversible defect on stress test in 2022, cardiac catheterization 02/10/2021 revealed ostial proximal RCA 30%, mid to distal LAD 30%, distal LAD tapers to small caliber vessel but does not reach the apex.  Severely dilated LVEDP of 38 mmHg which was a false positive test, diastolic heart failure, HTN, HLD, morbid obesity, and DOE here for follow-up appointment.  Patient was seen in follow-up March of last year and was diagnosed with sleep apnea I was trying to get a CPAP but not tolerating it.  Has not felt any atrial fibrillation and was in normal sinus rhythm at the time of the visit.  Denied chest pain, dyspnea, edema, palpitations.  Stopped Eliquis  on his own and Dr. Sheryle advised him to restart.  No regular exercise because of knee issues.  Today, he presents with sleep apnea and restless leg syndrome with difficulty tolerating CPAP therapy.  He experiences difficulty tolerating CPAP therapy due to discomfort and mask restriction, exacerbated by restless leg syndrome. He takes Mirapex 2-3 hours before bed, sometimes requiring an additional dose at night, but still achieves only 1-2 hours of sleep.  He takes aspirin  instead of Eliquis , finding it beneficial for restless legs. He takes two full-dose aspirin  tablets daily without gastrointestinal issues.  He has a history of atrial fibrillation treated with cardioversion in 2020, with no subsequent palpitations or symptoms. He denies chest pain or shortness of breath but experiences heavy breathing with exertion, attributed to being overweight and knee issues.  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  Reports no shortness of breath nor dyspnea on exertion. Reports no chest pain, pressure, or tightness. No edema, orthopnea, PND. Reports no palpitations.   Discussed the use of AI scribe software for clinical note transcription with the patient, who gave verbal consent to proceed.   ROS: pertinent ROS in HPI  Studies Reviewed EKG Interpretation Date/Time:  Tuesday June 30 2024 08:42:44 EDT Ventricular Rate:  68 PR Interval:  186 QRS Duration:  82 QT Interval:  394 QTC Calculation: 418 R Axis:   -8  Text Interpretation: Normal sinus rhythm Anterior infarct (cited on or before 28-Mar-2021) When compared with ECG of 28-Mar-2021 15:23, Questionable change in initial forces of Anterior leads Nonspecific T wave abnormality now evident in Lateral leads Confirmed by Lucien Blanc [phone]) on 06/30/2024 9:34:19 AM    Cardiac Cath 02/10/2021: Ostial proximal RCA 30%.  Mid to distal LAD 30% distal LAD tapers to small caliber vessel but does not reach the apex.  Severely elevated LVEDP of 30 mmHg. ==> false-positive stress test, but diastolic heart failure..    Risk Assessment/Calculations  CHA2DS2-VASc Score = 3  This indicates a 3.2% annual risk of stroke. The patient's score is based upon: CHF History: 1 HTN History: 1 Diabetes History: 0 Stroke History: 0 Vascular Disease History: 1 Age Score: 0 Gender Score: 0   HYPERTENSION CONTROL Vitals:   06/30/24 0844 06/30/24 0942  BP: (!) 170/82 (!) 144/82    The patient's blood pressure is elevated above target today.  In order  to address the patient's elevated BP:           Physical Exam VS:  BP (!) 144/82   Pulse 68   Ht 6' 2 (1.88 m)   Wt (!) 324 lb (147 kg)   SpO2 99%   BMI 41.60 kg/m        Wt Readings from Last 3 Encounters:  06/30/24 (!) 324 lb (147 kg)  04/22/24 (!) 306 lb (138.8 kg)   03/07/23 (!) 310 lb (140.6 kg)    GEN: Well nourished, well developed in no acute distress NECK: No JVD; No carotid bruits CARDIAC: RRR, no murmurs, rubs, gallops RESPIRATORY:  Clear to auscultation without rales, wheezing or rhonchi  ABDOMEN: Soft, non-tender, non-distended EXTREMITIES:  No edema; No deformity   ASSESSMENT AND PLAN  Preop clearance  Michael Fuller perioperative risk of a major cardiac event is 0.9% according to the Revised Cardiac Risk Index (RCRI).  Therefore, he is at low risk for perioperative complications.   His functional capacity is good at 5.62 METs according to the Duke Activity Status Index (DASI). Recommendations: According to ACC/AHA guidelines, no further cardiovascular testing needed.  The patient may proceed to surgery at acceptable risk.   Antiplatelet and/or Anticoagulation Recommendations: Aspirin  can be held for 5 days prior to his surgery.  Please resume Aspirin  post operatively when it is felt to be safe from a bleeding standpoint.   Obstructive sleep apnea with intolerance to CPAP Intolerance due to discomfort and restricted movement. BMI of 41 precludes Inspire device. - Refer to Dr. Shlomo for evaluation and management, including alternative mask options or nasal cannula.  Restless legs syndrome Managed with Mirapex, partial relief. Significant sleep disturbances persist. Aspirin  used for symptomatic relief.  Obesity (BMI 41) BMI impacts eligibility for treatments like Inspire device. Weight management crucial for health improvement.  Atrial fibrillation, status post cardioversion, not currently detected Previously treated with cardioversion, currently in normal sinus rhythm. Prefers aspirin  over Eliquis . Discussed stroke prevention benefits of Eliquis . CHADSVASc score indicates 3% annual stroke risk.  Hypertension Current elevated reading likely due to stress and lack of sleep. Usual BP around 140/70-80 mmHg. -continue current  medications and track at home -retake was 144/82  Chronic diastolic CHF -euvolemic on exam today -continue current medication regimen      Dispo: He can follow-up in 6 months with Dr. Anner  Signed, Orren LOISE Fabry, PA-C

## 2024-06-30 ENCOUNTER — Ambulatory Visit: Attending: Physician Assistant | Admitting: Physician Assistant

## 2024-06-30 ENCOUNTER — Encounter: Payer: Self-pay | Admitting: Physician Assistant

## 2024-06-30 VITALS — BP 144/82 | HR 68 | Ht 74.0 in | Wt 324.0 lb

## 2024-06-30 DIAGNOSIS — G4733 Obstructive sleep apnea (adult) (pediatric): Secondary | ICD-10-CM | POA: Diagnosis not present

## 2024-06-30 DIAGNOSIS — I10 Essential (primary) hypertension: Secondary | ICD-10-CM | POA: Diagnosis not present

## 2024-06-30 DIAGNOSIS — E7849 Other hyperlipidemia: Secondary | ICD-10-CM

## 2024-06-30 DIAGNOSIS — I5032 Chronic diastolic (congestive) heart failure: Secondary | ICD-10-CM

## 2024-06-30 DIAGNOSIS — I4819 Other persistent atrial fibrillation: Secondary | ICD-10-CM

## 2024-06-30 DIAGNOSIS — I2510 Atherosclerotic heart disease of native coronary artery without angina pectoris: Secondary | ICD-10-CM | POA: Diagnosis not present

## 2024-06-30 DIAGNOSIS — Z01818 Encounter for other preprocedural examination: Secondary | ICD-10-CM | POA: Diagnosis not present

## 2024-06-30 MED ORDER — DILTIAZEM HCL ER COATED BEADS 180 MG PO CP24: 180.0000 mg | ORAL_CAPSULE | Freq: Every day | ORAL | 3 refills | Status: AC

## 2024-06-30 MED ORDER — ROSUVASTATIN CALCIUM 20 MG PO TABS: 20.0000 mg | ORAL_TABLET | Freq: Every day | ORAL | 3 refills | Status: AC

## 2024-06-30 MED ORDER — VENLAFAXINE HCL 75 MG PO TABS: 75.0000 mg | ORAL_TABLET | Freq: Two times a day (BID) | ORAL | 3 refills | Status: AC

## 2024-06-30 NOTE — Patient Instructions (Signed)
 Medication Instructions:  Your physician recommends that you continue on your current medications as directed. Please refer to the Current Medication list given to you today.  *If you need a refill on your cardiac medications before your next appointment, please call your pharmacy*  Lab Work: NONE If you have labs (blood work) drawn today and your tests are completely normal, you will receive your results only by: MyChart Message (if you have MyChart) OR A paper copy in the mail If you have any lab test that is abnormal or we need to change your treatment, we will call you to review the results.  Testing/Procedures: NONE  Follow-Up: At [facility], you and your health needs are our priority.  As part of our continuing mission to provide you with exceptional heart care, our providers are all part of one team.  This team includes your primary Cardiologist (physician) and Advanced Practice Providers or APPs (Physician Assistants and Nurse Practitioners) who all work together to provide you with the care you need, when you need it.  Your next appointment:   6 month(s)  Provider:   Alm Clay, MD   We recommend signing up for the patient portal called MyChart.  Sign up information is provided on this After Visit Summary.  MyChart is used to connect with patients for Virtual Visits (Telemedicine).  Patients are able to view lab/test results, encounter notes, upcoming appointments, etc.  Non-urgent messages can be sent to your provider as well.   To learn more about what you can do with MyChart, go to [email or web address].

## 2024-06-30 NOTE — Addendum Note (Signed)
 Addended by: LENNIE DEFOREST on: 06/30/2024 11:33 AM   Modules accepted: Orders

## 2024-07-13 ENCOUNTER — Encounter (HOSPITAL_COMMUNITY)

## 2024-07-27 ENCOUNTER — Ambulatory Visit (HOSPITAL_COMMUNITY): Admit: 2024-07-27 | Admitting: Orthopedic Surgery

## 2024-07-27 SURGERY — ARTHROPLASTY, KNEE, TOTAL
Anesthesia: Choice | Site: Knee | Laterality: Right

## 2024-08-13 DIAGNOSIS — H524 Presbyopia: Secondary | ICD-10-CM | POA: Diagnosis not present

## 2024-10-28 DIAGNOSIS — G2581 Restless legs syndrome: Secondary | ICD-10-CM | POA: Diagnosis not present

## 2024-10-28 DIAGNOSIS — G4733 Obstructive sleep apnea (adult) (pediatric): Secondary | ICD-10-CM | POA: Diagnosis not present

## 2024-10-28 DIAGNOSIS — I10 Essential (primary) hypertension: Secondary | ICD-10-CM | POA: Diagnosis not present

## 2024-10-28 DIAGNOSIS — Z23 Encounter for immunization: Secondary | ICD-10-CM | POA: Diagnosis not present

## 2024-10-28 DIAGNOSIS — A001 Cholera due to Vibrio cholerae 01, biovar eltor: Secondary | ICD-10-CM | POA: Diagnosis not present

## 2024-10-28 DIAGNOSIS — N529 Male erectile dysfunction, unspecified: Secondary | ICD-10-CM | POA: Diagnosis not present

## 2024-10-28 DIAGNOSIS — I480 Paroxysmal atrial fibrillation: Secondary | ICD-10-CM | POA: Diagnosis not present

## 2024-10-28 DIAGNOSIS — F1090 Alcohol use, unspecified, uncomplicated: Secondary | ICD-10-CM | POA: Diagnosis not present

## 2024-11-03 DIAGNOSIS — M25522 Pain in left elbow: Secondary | ICD-10-CM | POA: Diagnosis not present

## 2024-11-05 ENCOUNTER — Encounter: Payer: Self-pay | Admitting: *Deleted

## 2024-11-05 NOTE — Progress Notes (Signed)
 Michael Fuller                                          MRN: [id number]   11/05/2024   The VBCI Quality Team Specialist reviewed this patient medical record for the purposes of chart review for care gap closure. The following were reviewed: chart review for care gap closure-controlling blood pressure.    VBCI Quality Team

## 2024-11-06 ENCOUNTER — Telehealth (HOSPITAL_BASED_OUTPATIENT_CLINIC_OR_DEPARTMENT_OTHER): Payer: Self-pay | Admitting: *Deleted

## 2024-11-06 NOTE — Telephone Encounter (Signed)
" ° °  Name: Michael Fuller  DOB: 13-Jan-1961  MRN: [id number]  Primary Cardiologist: Alm Clay, MD  Preoperative team, please contact this patient and set up a phone call appointment for further preoperative risk assessment. Please obtain consent and complete medication review. Thank you for your help.  - Regarding pharmacy clearance: Will route this to pharmD for recommendations for holding Eliquis .  I also confirmed the patient resides in the state of [place] . As per [facility] Medical Board telemedicine laws, the patient must reside in the state in which the provider is licensed.  Saddie GORMAN Cleaves, NP 11/06/2024, 2:07 PM [facility]    "

## 2024-11-06 NOTE — Telephone Encounter (Signed)
"  ° °  Pre-operative Risk Assessment    Patient Name: Michael Fuller  DOB: 02-25-1961 MRN: [id number]   Date of last office visit: 06/30/24 ORREN FABRY, PAC Date of next office visit: NONE   Request for Surgical Clearance    Procedure:  RIGHT TOTAL KNEE ARTHROPLASTY  Date of Surgery:  Clearance 01/11/25                                Surgeon:  DR. DEMPSEY MOAN Surgeon's Group or Practice Name:  [facility] Phone number:  [phone] KERRI MAZE Fax number:  [phone]   Type of Clearance Requested:   - Medical  - Pharmacy:  Hold Apixaban  (Eliquis )     Type of Anesthesia:  CHOICE   Additional requests/questions:    Bonney Niels Jest   11/06/2024, 1:25 PM   "

## 2024-11-06 NOTE — Telephone Encounter (Signed)
 1st attempt to reach pt regarding surgical clearance and the need for an TELE appointment.  Left pt a detailed message to call back and get that scheduled.

## 2024-11-09 ENCOUNTER — Telehealth: Payer: Self-pay | Admitting: *Deleted

## 2024-11-09 NOTE — Telephone Encounter (Signed)
"  °  Patient Consent for Virtual Visit        Michael Fuller has provided verbal consent on 11/09/2024 for a virtual visit (video or telephone).   CONSENT FOR VIRTUAL VISIT FOR:  Michael Fuller  By participating in this virtual visit I agree to the following:  I hereby voluntarily request, consent and authorize [facility] and its employed or contracted physicians, physician assistants, nurse practitioners or other licensed health care professionals (the Practitioner), to provide me with telemedicine health care services (the Services) as deemed necessary by the treating Practitioner. I acknowledge and consent to receive the Services by the Practitioner via telemedicine. I understand that the telemedicine visit will involve communicating with the Practitioner through live audiovisual communication technology and the disclosure of certain medical information by electronic transmission. I acknowledge that I have been given the opportunity to request an in-person assessment or other available alternative prior to the telemedicine visit and am voluntarily participating in the telemedicine visit.  I understand that I have the right to withhold or withdraw my consent to the use of telemedicine in the course of my care at any time, without affecting my right to future care or treatment, and that the Practitioner or I may terminate the telemedicine visit at any time. I understand that I have the right to inspect all information obtained and/or recorded in the course of the telemedicine visit and may receive copies of available information for a reasonable fee.  I understand that some of the potential risks of receiving the Services via telemedicine include:  Delay or interruption in medical evaluation due to technological equipment failure or disruption; Information transmitted may not be sufficient (e.g. poor resolution of images) to allow for appropriate medical decision making by the  Practitioner; and/or  In rare instances, security protocols could fail, causing a breach of personal health information.  Furthermore, I acknowledge that it is my responsibility to provide information about my medical history, conditions and care that is complete and accurate to the best of my ability. I acknowledge that Practitioner's advice, recommendations, and/or decision may be based on factors not within their control, such as incomplete or inaccurate data provided by me or distortions of diagnostic images or specimens that may result from electronic transmissions. I understand that the practice of medicine is not an exact science and that Practitioner makes no warranties or guarantees regarding treatment outcomes. I acknowledge that a copy of this consent can be made available to me via my patient portal [facility] MyChart), or I can request a printed copy by calling the office of  Heart[facility].    I understand that my insurance will be billed for this visit.   I have read or had this consent read to me. I understand the contents of this consent, which adequately explains the benefits and risks of the Services being provided via telemedicine.  I have been provided ample opportunity to ask questions regarding this consent and the Services and have had my questions answered to my satisfaction. I give my informed consent for the services to be provided through the use of telemedicine in my medical care    "

## 2024-11-09 NOTE — Telephone Encounter (Signed)
 Per Op visit scheduled med rec and consent done.

## 2024-11-13 NOTE — Telephone Encounter (Signed)
 Patient with diagnosis of atrial fibrillation on Eliquis  for anticoagulation.  (Office notes indicate that patient has stopped Eliquis  and instead takes ASA 650 mg daily)   Procedure:  RIGHT TOTAL KNEE ARTHROPLASTY   Date of Surgery:  Clearance 01/11/25   CHA2DS2-VASc Score = 3   This indicates a 3.2% annual risk of stroke. The patient's score is based upon: CHF History: 1 HTN History: 1 Diabetes History: 0 Stroke History: 0 Vascular Disease History: 1 Age Score: 0 Gender Score: 0    CrCl > 100 Platelet count 201  Patient has not had an Afib/aflutter ablation in the last 3 months, DCCV within the last 4 weeks or a watchman implanted in the last 45 days   Per office protocol, patient can hold Eliquis  for 2 days prior to procedure.   Patient will not need bridging with Lovenox (enoxaparin) around procedure.  **This guidance is not considered finalized until pre-operative APP has relayed final recommendations.**

## 2024-11-16 ENCOUNTER — Ambulatory Visit: Attending: Cardiovascular Disease | Admitting: Physician Assistant

## 2024-11-16 DIAGNOSIS — Z01810 Encounter for preprocedural cardiovascular examination: Secondary | ICD-10-CM

## 2024-11-16 NOTE — Progress Notes (Signed)
 "   Virtual Visit via Telephone Note   Because of NAVI ERBER co-morbid illnesses, he is at least at moderate risk for complications without adequate follow up.  This format is felt to be most appropriate for this patient at this time.  Due to technical limitations with video connection (technology), today's appointment will be conducted as an audio only telehealth visit, and Michael Fuller verbally agreed to proceed in this manner.   All issues noted in this document were discussed and addressed.  No physical exam could be performed with this format.  Evaluation Performed:  Preoperative cardiovascular risk assessment _____________   Date:  11/16/2024   Patient ID:  Michael Fuller, DOB 1960/12/03, MRN [id number] Patient Location:  Home Provider location:   Office  Primary Care Provider:  Sheryle Carwin, MD Primary Cardiologist:  Alm Clay, MD  Chief Complaint / Patient Profile   63 y.o. y/o male with a h/o persistent atrial fibrillation with DCCV 12/2020 on Xarelto , cardiac catheterization with nonobstructive CAD, diastolic heart failure, HTN, HLD, morbid obesity, and DOE who is pending right total knee arthroplasty and presents today for telephonic preoperative cardiovascular risk assessment.  History of Present Illness    Michael Fuller is a 63 y.o. male who presents via audio/video conferencing for a telehealth visit today.  Pt was last seen in cardiology clinic on 06/30/2024 by myself.  At that time Michael Fuller was doing well .  The patient is now pending procedure as outlined above. Since his last visit, he tells me he has been well from a heart standpoint.  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  To his knowledge he has not been in atrial fibrillation.  He states that he has been asymptomatic and every time that a doctor checks him he is in normal sinus rhythm.  He is off the Eliquis  because of side affects. He could not get out  of bed. Once he stopped taking it he felt better.   Problems protocol, patient can hold Eliquis  for 2 days prior to procedure.  He will not need bridging with Lovenox around the procedure.  (Currently not on Eliquis ).  Past Medical History    Past Medical History:  Diagnosis Date   Anxiety disorder    Complication of anesthesia    Nausae   COVID-19 virus infection 11/08/2020   Cough and congestion for several weeks with 4 to 5 days worsening.   DJD (degenerative joint disease), lumbosacral    Hyperlipidemia due to dietary fat intake    Nephrolithiasis    Osteoarthritis involving multiple joints on both sides of body    Persistent atrial fibrillation (HCC) 10/2020   Initial diagnosis with the setting of COVID-19 infection   RLS (restless legs syndrome)    Past Surgical History:  Procedure Laterality Date   BACK SURGERY  1996   Lumbosacral spine--two surgeries   CARDIOVERSION N/A 01/12/2021   Procedure: SUCCESSFUL CARDIOVERSION;  Surgeon: Maranda Leim DEL, MD;  Location[facility];  Service: Cardiovascular;  Laterality: N/A;   CARPAL TUNNEL RELEASE Bilateral    LEFT HEART CATH AND CORONARY ANGIOGRAPHY N/A 02/10/2021   Procedure: LEFT HEART CATH AND CORONARY ANGIOGRAPHY;  Surgeon: Clay Alm ORN, MD;  Location: [facility] INVASIVE CV LAB;  Service: Cardiovascular; Ostial proximal RCA 30%.  Mid to distal LAD 30% distal LAD tapers to small caliber vessel but does not reach the apex.  Severely elevated LVEDP of 30 mmHg. ==>  false-positive stress test, but diastolic heart failure.SABRA   MASS EXCISION Right 04/26/2021   Procedure: MINOR EXCISION OF SKIN NEOPLASM; BUTTOCK;  Surgeon: Mavis Anes, MD;  Location: [facility];  Service: General;  Laterality: Right;   NM MYOVIEW  LTD  01/11/2021    INTERMEDIATE RISK study consistent with ischemia.  Medium size mild severity reversible perfusion defect in the basal inferoseptal, mid inferoseptal and apical inferior location.  Unable to calculate EF and wall  motion due to A. fib. --FALSE POSITIVE   SHOULDER SURGERY Bilateral    TRANSTHORACIC ECHOCARDIOGRAM  11/25/2020   (Post COVID & New Afib): EF 65 to 70%.  Hyperdynamic LV.  Normal diastolic pressures.  Moderate LA dilation.  Otherwise normal study.  Valves, normal RV/RA pressure.    Allergies  Allergies[1]  Home Medications    Prior to Admission medications  Medication Sig Start Date End Date Taking? Authorizing Provider  apixaban  (ELIQUIS ) 5 MG TABS tablet Take 1 tablet (5 mg total) by mouth 2 (two) times daily. 01/28/23   Parthenia Olivia HERO, PA-C  Carboxymethylcellul-Glycerin (CLEAR EYES FOR DRY EYES) 1-0.25 % SOLN Place 1 drop into both eyes daily as needed (Dry eyes).    [provider]  diltiazem  (CARDIZEM  CD) 180 MG 24 hr capsule Take 1 capsule (180 mg total) by mouth daily. 06/30/24   Lucien Orren SAILOR, PA-C  pramipexole (MIRAPEX) 1 MG tablet Take 1 mg by mouth daily.    [provider]  pramipexole (MIRAPEX) 1.5 MG tablet Take 1.5 mg by mouth at bedtime. 10/30/24   [provider]  rosuvastatin  (CRESTOR ) 20 MG tablet Take 1 tablet (20 mg total) by mouth at bedtime. 06/30/24   Lucien Orren SAILOR, PA-C  sildenafil (VIAGRA) 100 MG tablet Take 100 mg by mouth as needed. 10/28/24   [provider]  venlafaxine  (EFFEXOR ) 75 MG tablet Take 1 tablet (75 mg total) by mouth 2 (two) times daily. 06/30/24   Lucien Orren SAILOR, PA-C    Physical Exam    Vital Signs:  Michael Fuller does not have vital signs available for review today.  Given telephonic nature of communication, physical exam is limited. AAOx3. NAD. Normal affect.  Speech and respirations are unlabored.  Accessory Clinical Findings    None  Assessment & Plan    1.  Preoperative Cardiovascular Risk Assessment:  Michael Fuller perioperative risk of a major cardiac event is 0.4% according to the Revised Cardiac Risk Index (RCRI).  Therefore, he is at low risk for perioperative complications.   His  functional capacity is good at 5.62 METs according to the Duke Activity Status Index (DASI). Recommendations: According to ACC/AHA guidelines, no further cardiovascular testing needed.  The patient may proceed to surgery at acceptable risk.   Antiplatelet and/or Anticoagulation Recommendations:  Eliquis  (Apixaban ) can be held for 2 days prior to surgery.  Please resume post op when felt to be safe.   (Not taking)  The patient was advised that if he develops new symptoms prior to surgery to contact our office to arrange for a follow-up visit, and he verbalized understanding.   A copy of this note will be routed to requesting surgeon.  Time:   Today, I have spent 8 minutes with the patient with telehealth technology discussing medical history, symptoms, and management plan.     Orren SAILOR Lucien, PA-C  11/16/2024, 8:11 AM      [1] No Known Allergies  "

## 2024-11-23 ENCOUNTER — Ambulatory Visit: Payer: Self-pay

## 2024-11-23 NOTE — Progress Notes (Signed)
 Reports request to check BP and noted manual as 140/82 and noted irregular pulse 80.s with reported Afib post covid.  Stated he has notified both PCP and 'cardiology that he took himself off Eliquis  due to side effects and is taking ASA.  Instructed to call his PCP or cardiology to schedule appt.  Also reports upcoming knee replacement surgery and is in the process of being cleared for surgery.  No distress at this time and stated he will call his providers.

## 2024-11-26 ENCOUNTER — Other Ambulatory Visit: Payer: Self-pay

## 2024-12-03 NOTE — Progress Notes (Signed)
 Michael Fuller                                          MRN: [id number]   12/03/2024   The [facility] Quality Team Specialist reviewed this patient medical record for the purposes of chart review for care gap closure. The following were reviewed: chart review for care gap closure-controlling blood pressure.    [facility] Quality Team

## 2025-01-11 ENCOUNTER — Ambulatory Visit (HOSPITAL_COMMUNITY): Admit: 2025-01-11 | Admitting: Orthopedic Surgery
